# Patient Record
Sex: Male | Born: 1970
Health system: Southern US, Community
[De-identification: ages and names within clinical notes are randomized; demographics above are authoritative.]

## PROBLEM LIST (undated history)

## (undated) DIAGNOSIS — B019 Varicella without complication: Secondary | ICD-10-CM

## (undated) DIAGNOSIS — F32A Depression, unspecified: Secondary | ICD-10-CM

## (undated) DIAGNOSIS — E785 Hyperlipidemia, unspecified: Secondary | ICD-10-CM

## (undated) DIAGNOSIS — F329 Major depressive disorder, single episode, unspecified: Secondary | ICD-10-CM

## (undated) HISTORY — DX: Depression, unspecified: F32.A

## (undated) HISTORY — DX: Hyperlipidemia, unspecified: E78.5

## (undated) HISTORY — DX: Major depressive disorder, single episode, unspecified: F32.9

## (undated) HISTORY — DX: Varicella without complication: B01.9

---

## 2013-04-14 ENCOUNTER — Ambulatory Visit: Payer: Self-pay | Admitting: Internal Medicine

## 2013-05-26 ENCOUNTER — Ambulatory Visit (INDEPENDENT_AMBULATORY_CARE_PROVIDER_SITE_OTHER): Payer: 59 | Admitting: Internal Medicine

## 2013-05-26 ENCOUNTER — Encounter: Payer: Self-pay | Admitting: Internal Medicine

## 2013-05-26 VITALS — BP 130/84 | HR 95 | Temp 98.5°F | Ht 72.0 in | Wt 176.5 lb

## 2013-05-26 DIAGNOSIS — A6000 Herpesviral infection of urogenital system, unspecified: Secondary | ICD-10-CM

## 2013-05-26 DIAGNOSIS — Z23 Encounter for immunization: Secondary | ICD-10-CM

## 2013-05-26 DIAGNOSIS — Z0000 Encounter for general adult medical examination without abnormal findings: Secondary | ICD-10-CM

## 2013-05-26 LAB — COMPREHENSIVE METABOLIC PANEL
ALK PHOS: 60 U/L (ref 39–117)
ALT: 39 U/L (ref 0–53)
AST: 32 U/L (ref 0–37)
Albumin: 4.6 g/dL (ref 3.5–5.2)
BILIRUBIN TOTAL: 1 mg/dL (ref 0.3–1.2)
BUN: 15 mg/dL (ref 6–23)
CO2: 31 meq/L (ref 19–32)
Calcium: 9.1 mg/dL (ref 8.4–10.5)
Chloride: 100 mEq/L (ref 96–112)
Creatinine, Ser: 1.1 mg/dL (ref 0.4–1.5)
GFR: 80.54 mL/min (ref >60.00)
Glucose, Bld: 72 mg/dL (ref 70–99)
Potassium: 4 mEq/L (ref 3.5–5.1)
SODIUM: 140 meq/L (ref 135–145)
Total Protein: 7.4 g/dL (ref 6.0–8.3)

## 2013-05-26 LAB — CBC
HCT: 45.8 % (ref 39.0–52.0)
Hemoglobin: 15.4 g/dL (ref 13.0–17.0)
MCHC: 33.8 g/dL (ref 30.0–36.0)
MCV: 89.2 fl (ref 78.0–100.0)
Platelets: 241 10*3/uL (ref 150.0–400.0)
RBC: 5.13 Mil/uL (ref 4.22–5.81)
RDW: 12.9 % (ref 11.5–14.6)
WBC: 6.9 10*3/uL (ref 4.5–10.5)

## 2013-05-26 LAB — LIPID PANEL
CHOL/HDL RATIO: 6
Cholesterol: 215 mg/dL — ABNORMAL HIGH (ref 0–200)
HDL: 37.5 mg/dL — ABNORMAL LOW (ref >39.00)
LDL CALC: 143 mg/dL — AB (ref 0–99)
Triglycerides: 171 mg/dL — ABNORMAL HIGH (ref 0.0–149.0)
VLDL: 34.2 mg/dL (ref 0.0–40.0)

## 2013-05-26 MED ORDER — ACYCLOVIR 200 MG PO CAPS: 200.0000 mg | ORAL_CAPSULE | Freq: Two times a day (BID) | ORAL | Status: DC

## 2013-05-26 NOTE — Addendum Note (Signed)
Addended by: Lurlean Nanny on: 05/26/2013 02:06 PM   Modules accepted: Orders

## 2013-05-26 NOTE — Progress Notes (Signed)
Pre visit review using our clinic review tool, if applicable. No additional management support is needed unless otherwise documented below in the visit note. 

## 2013-05-26 NOTE — Assessment & Plan Note (Signed)
Will start acycolvir

## 2013-05-26 NOTE — Progress Notes (Signed)
HPI  Pt presents to the clinic today to establish care. He is transferring care from Dr. Arelia Sneddon at [facility] practice. He has no concerns.  Flu: never Tetanus: more than 10 years ago Eye Doctor: as needed Dentist: biannually  Past Medical History  Diagnosis Date  . Chicken pox   . Hyperlipidemia   . Depression     No current outpatient prescriptions on file.   No current facility-administered medications for this visit.    No Known Allergies  Family History  Problem Relation Age of Onset  . Diabetes Father     History   Social History  . Marital Status: Married    Spouse Name: N/A    Number of Children: N/A  . Years of Education: N/A   Occupational History  . Not on file.   Social History Main Topics  . Smoking status: Never Smoker   . Smokeless tobacco: Never Used  . Alcohol Use: Yes     Comment: occasional  . Drug Use: No  . Sexual Activity: Not on file   Other Topics Concern  . Not on file   Social History Narrative  . No narrative on file    ROS:  Constitutional: Denies fever, malaise, fatigue, headache or abrupt weight changes.  HEENT: Denies eye pain, eye redness, ear pain, ringing in the ears, wax buildup, runny nose, nasal congestion, bloody nose, or sore throat. Respiratory: Denies difficulty breathing, shortness of breath, cough or sputum production.   Cardiovascular: Denies chest pain, chest tightness, palpitations or swelling in the hands or feet.  Gastrointestinal: Denies abdominal pain, bloating, constipation, diarrhea or blood in the stool.  GU: Denies frequency, urgency, pain with urination, blood in urine, odor or discharge. Musculoskeletal: Denies decrease in range of motion, difficulty with gait, muscle pain or joint pain and swelling.  Skin: Pt reports rash on genitalia.   Neurological: Denies dizziness, difficulty with memory, difficulty with speech or problems with balance and coordination.   No other specific  complaints in a complete review of systems (except as listed in HPI above).  PE:  BP 130/84  Pulse 95  Temp(Src) 98.5 F (36.9 C) (Oral)  Ht 6' (1.829 m)  Wt 176 lb 8 oz (80.06 kg)  BMI 23.93 kg/m2  SpO2 98% Wt Readings from Last 3 Encounters:  05/26/13 176 lb 8 oz (80.06 kg)    General: Appears his stated age, well developed, well nourished in NAD. Skin: Herpes noted on genitalia.  HEENT: Head: normal shape and size; Eyes: sclera white, no icterus, conjunctiva pink, PERRLA and EOMs intact; Ears: Tm's gray and intact, normal light reflex; Nose: mucosa pink and moist, septum midline; Throat/Mouth: Teeth present, mucosa pink and moist, no lesions or ulcerations noted.  Neck: Normal range of motion. Neck supple, trachea midline. No massses, lumps or thyromegaly present.  Cardiovascular: Normal rate and rhythm. S1,S2 noted.  No murmur, rubs or gallops noted. No JVD or BLE edema. No carotid bruits noted. Pulmonary/Chest: Normal effort and positive vesicular breath sounds. No respiratory distress. No wheezes, rales or ronchi noted.  Abdomen: Soft and nontender. Normal bowel sounds, no bruits noted. No distention or masses noted. Liver, spleen and kidneys non palpable. Musculoskeletal: Normal range of motion. No signs of joint swelling. No difficulty with gait.  Neurological: Alert and oriented. Cranial nerves II-XII intact. Coordination normal. +DTRs bilaterally. Psychiatric: Mood and affect normal. Behavior is normal. Judgment and thought content normal.      Assessment and Plan:  Preventative Health  Maintenance:  Will give Tdap today Will obtain basic screening labs today  RTC in 1 year or sooner if needed

## 2013-05-26 NOTE — Patient Instructions (Addendum)

## 2013-09-22 ENCOUNTER — Ambulatory Visit: Payer: 59 | Admitting: Psychology

## 2014-02-26 HISTORY — PX: LUMBAR DISC SURGERY: SHX700

## 2014-07-29 ENCOUNTER — Ambulatory Visit (INDEPENDENT_AMBULATORY_CARE_PROVIDER_SITE_OTHER): Payer: 59 | Admitting: Internal Medicine

## 2014-07-29 ENCOUNTER — Encounter: Payer: Self-pay | Admitting: Internal Medicine

## 2014-07-29 VITALS — BP 132/88 | HR 71 | Temp 98.1°F | Wt 189.0 lb

## 2014-07-29 DIAGNOSIS — M5442 Lumbago with sciatica, left side: Secondary | ICD-10-CM

## 2014-07-29 DIAGNOSIS — A6000 Herpesviral infection of urogenital system, unspecified: Secondary | ICD-10-CM | POA: Diagnosis not present

## 2014-07-29 MED ORDER — PREDNISONE 10 MG PO TABS: ORAL_TABLET | ORAL | Status: DC

## 2014-07-29 MED ORDER — ACYCLOVIR 200 MG PO CAPS: 200.0000 mg | ORAL_CAPSULE | Freq: Two times a day (BID) | ORAL | Status: DC

## 2014-07-29 MED ORDER — NABUMETONE 750 MG PO TABS: 750.0000 mg | ORAL_TABLET | Freq: Two times a day (BID) | ORAL | Status: DC

## 2014-07-29 NOTE — Patient Instructions (Addendum)
Prednisone and Relafen have been sent to your pharmacy. Start the Relafen after you finish the Prednisone. See Vaughan Basta on the way out to schedule your MRI I am requesting your previous imagine from [facility] was also refill  Sciatica with Rehab The sciatic nerve runs from the back down the leg and is responsible for sensation and control of the muscles in the back (posterior) side of the thigh, lower leg, and foot. Sciatica is a condition that is characterized by inflammation of this nerve.  SYMPTOMS   Signs of nerve damage, including numbness and/or weakness along the posterior side of the lower extremity.  Pain in the back of the thigh that may also travel down the leg.  Pain that worsens when sitting for long periods of time.  Occasionally, pain in the back or buttock. CAUSES  Inflammation of the sciatic nerve is the cause of sciatica. The inflammation is due to something irritating the nerve. Common sources of irritation include:  Sitting for long periods of time.  Direct trauma to the nerve.  Arthritis of the spine.  Herniated or ruptured disk.  Slipping of the vertebrae (spondylolisthesis).  Pressure from soft tissues, such as muscles or ligament-like tissue (fascia). RISK INCREASES WITH:  Sports that place pressure or stress on the spine (football or weightlifting).  Poor strength and flexibility.  Failure to warm up properly before activity.  Family history of low back pain or disk disorders.  Previous back injury or surgery.  Poor body mechanics, especially when lifting, or poor posture. PREVENTION   Warm up and stretch properly before activity.  Maintain physical fitness:  Strength, flexibility, and endurance.  Cardiovascular fitness.  Learn and use proper technique, especially with posture and lifting. When possible, have coach correct improper technique.  Avoid activities that place stress on the spine. PROGNOSIS If treated properly,  then sciatica usually resolves within 6 weeks. However, occasionally surgery is necessary.  RELATED COMPLICATIONS   Permanent nerve damage, including pain, numbness, tingle, or weakness.  Chronic back pain.  Risks of surgery: infection, bleeding, nerve damage, or damage to surrounding tissues. TREATMENT Treatment initially involves resting from any activities that aggravate your symptoms. The use of ice and medication may help reduce pain and inflammation. The use of strengthening and stretching exercises may help reduce pain with activity. These exercises may be performed at home or with referral to a therapist. A therapist may recommend further treatments, such as transcutaneous electronic nerve stimulation (TENS) or ultrasound. Your caregiver may recommend corticosteroid injections to help reduce inflammation of the sciatic nerve. If symptoms persist despite non-surgical (conservative) treatment, then surgery may be recommended. MEDICATION  If pain medication is necessary, then nonsteroidal anti-inflammatory medications, such as aspirin and ibuprofen, or other minor pain relievers, such as acetaminophen, are often recommended.  Do not take pain medication for 7 days before surgery.  Prescription pain relievers may be given if deemed necessary by your caregiver. Use only as directed and only as much as you need.  Ointments applied to the skin may be helpful.  Corticosteroid injections may be given by your caregiver. These injections should be reserved for the most serious cases, because they may only be given a certain number of times. HEAT AND COLD  Cold treatment (icing) relieves pain and reduces inflammation. Cold treatment should be applied for 10 to 15 minutes every 2 to 3 hours for inflammation and pain and immediately after any activity that aggravates your symptoms. Use ice packs or massage the area with  a piece of ice (ice massage).  Heat treatment may be used prior to performing  the stretching and strengthening activities prescribed by your caregiver, physical therapist, or athletic trainer. Use a heat pack or soak the injury in warm water. SEEK MEDICAL CARE IF:  Treatment seems to offer no benefit, or the condition worsens.  Any medications produce adverse side effects. EXERCISES  RANGE OF MOTION (ROM) AND STRETCHING EXERCISES - Sciatica Most people with sciatic will find that their symptoms worsen with either excessive bending forward (flexion) or arching at the low back (extension). The exercises which will help resolve your symptoms will focus on the opposite motion. Your physician, physical therapist or athletic trainer will help you determine which exercises will be most helpful to resolve your low back pain. Do not complete any exercises without first consulting with your clinician. Discontinue any exercises which worsen your symptoms until you speak to your clinician. If you have pain, numbness or tingling which travels down into your buttocks, leg or foot, the goal of the therapy is for these symptoms to move closer to your back and eventually resolve. Occasionally, these leg symptoms will get better, but your low back pain may worsen; this is typically an indication of progress in your rehabilitation. Be certain to be very alert to any changes in your symptoms and the activities in which you participated in the 24 hours prior to the change. Sharing this information with your clinician will allow him/her to most efficiently treat your condition. These exercises may help you when beginning to rehabilitate your injury. Your symptoms may resolve with or without further involvement from your physician, physical therapist or athletic trainer. While completing these exercises, remember:   Restoring tissue flexibility helps normal motion to return to the joints. This allows healthier, less painful movement and activity.  An effective stretch should be held for at least 30  seconds.  A stretch should never be painful. You should only feel a gentle lengthening or release in the stretched tissue. FLEXION RANGE OF MOTION AND STRETCHING EXERCISES: STRETCH - Flexion, Single Knee to Chest   Lie on a firm bed or floor with both legs extended in front of you.  Keeping one leg in contact with the floor, bring your opposite knee to your chest. Hold your leg in place by either grabbing behind your thigh or at your knee.  Pull until you feel a gentle stretch in your low back. Hold __________ seconds.  Slowly release your grasp and repeat the exercise with the opposite side. Repeat __________ times. Complete this exercise __________ times per day.  STRETCH - Flexion, Double Knee to Chest  Lie on a firm bed or floor with both legs extended in front of you.  Keeping one leg in contact with the floor, bring your opposite knee to your chest.  Tense your stomach muscles to support your back and then lift your other knee to your chest. Hold your legs in place by either grabbing behind your thighs or at your knees.  Pull both knees toward your chest until you feel a gentle stretch in your low back. Hold __________ seconds.  Tense your stomach muscles and slowly return one leg at a time to the floor. Repeat __________ times. Complete this exercise __________ times per day.  STRETCH - Low Trunk Rotation   Lie on a firm bed or floor. Keeping your legs in front of you, bend your knees so they are both pointed toward the ceiling and your feet  are flat on the floor.  Extend your arms out to the side. This will stabilize your upper body by keeping your shoulders in contact with the floor.  Gently and slowly drop both knees together to one side until you feel a gentle stretch in your low back. Hold for __________ seconds.  Tense your stomach muscles to support your low back as you bring your knees back to the starting position. Repeat the exercise to the other side. Repeat  __________ times. Complete this exercise __________ times per day  EXTENSION RANGE OF MOTION AND FLEXIBILITY EXERCISES: STRETCH - Extension, Prone on Elbows  Lie on your stomach on the floor, a bed will be too soft. Place your palms about shoulder width apart and at the height of your head.  Place your elbows under your shoulders. If this is too painful, stack pillows under your chest.  Allow your body to relax so that your hips drop lower and make contact more completely with the floor.  Hold this position for __________ seconds.  Slowly return to lying flat on the floor. Repeat __________ times. Complete this exercise __________ times per day.  RANGE OF MOTION - Extension, Prone Press Ups  Lie on your stomach on the floor, a bed will be too soft. Place your palms about shoulder width apart and at the height of your head.  Keeping your back as relaxed as possible, slowly straighten your elbows while keeping your hips on the floor. You may adjust the placement of your hands to maximize your comfort. As you gain motion, your hands will come more underneath your shoulders.  Hold this position __________ seconds.  Slowly return to lying flat on the floor. Repeat __________ times. Complete this exercise __________ times per day.  STRENGTHENING EXERCISES - Sciatica  These exercises may help you when beginning to rehabilitate your injury. These exercises should be done near your "sweet spot." This is the neutral, low-back arch, somewhere between fully rounded and fully arched, that is your least painful position. When performed in this safe range of motion, these exercises can be used for people who have either a flexion or extension based injury. These exercises may resolve your symptoms with or without further involvement from your physician, physical therapist or athletic trainer. While completing these exercises, remember:   Muscles can gain both the endurance and the strength needed for  everyday activities through controlled exercises.  Complete these exercises as instructed by your physician, physical therapist or athletic trainer. Progress with the resistance and repetition exercises only as your caregiver advises.  You may experience muscle soreness or fatigue, but the pain or discomfort you are trying to eliminate should never worsen during these exercises. If this pain does worsen, stop and make certain you are following the directions exactly. If the pain is still present after adjustments, discontinue the exercise until you can discuss the trouble with your clinician. STRENGTHENING - Deep Abdominals, Pelvic Tilt   Lie on a firm bed or floor. Keeping your legs in front of you, bend your knees so they are both pointed toward the ceiling and your feet are flat on the floor.  Tense your lower abdominal muscles to press your low back into the floor. This motion will rotate your pelvis so that your tail bone is scooping upwards rather than pointing at your feet or into the floor.  With a gentle tension and even breathing, hold this position for __________ seconds. Repeat __________ times. Complete this exercise __________ times per  day.  STRENGTHENING - Abdominals, Crunches   Lie on a firm bed or floor. Keeping your legs in front of you, bend your knees so they are both pointed toward the ceiling and your feet are flat on the floor. Cross your arms over your chest.  Slightly tip your chin down without bending your neck.  Tense your abdominals and slowly lift your trunk high enough to just clear your shoulder blades. Lifting higher can put excessive stress on the low back and does not further strengthen your abdominal muscles.  Control your return to the starting position. Repeat __________ times. Complete this exercise __________ times per day.  STRENGTHENING - Quadruped, Opposite UE/LE Lift  Assume a hands and knees position on a firm surface. Keep your hands under your  shoulders and your knees under your hips. You may place padding under your knees for comfort.  Find your neutral spine and gently tense your abdominal muscles so that you can maintain this position. Your shoulders and hips should form a rectangle that is parallel with the floor and is not twisted.  Keeping your trunk steady, lift your right hand no higher than your shoulder and then your left leg no higher than your hip. Make sure you are not holding your breath. Hold this position __________ seconds.  Continuing to keep your abdominal muscles tense and your back steady, slowly return to your starting position. Repeat with the opposite arm and leg. Repeat __________ times. Complete this exercise __________ times per day.  STRENGTHENING - Abdominals and Quadriceps, Straight Leg Raise   Lie on a firm bed or floor with both legs extended in front of you.  Keeping one leg in contact with the floor, bend the other knee so that your foot can rest flat on the floor.  Find your neutral spine, and tense your abdominal muscles to maintain your spinal position throughout the exercise.  Slowly lift your straight leg off the floor about 6 inches for a count of 15, making sure to not hold your breath.  Still keeping your neutral spine, slowly lower your leg all the way to the floor. Repeat this exercise with each leg __________ times. Complete this exercise __________ times per day. POSTURE AND BODY MECHANICS CONSIDERATIONS - Sciatica Keeping correct posture when sitting, standing or completing your activities will reduce the stress put on different body tissues, allowing injured tissues a chance to heal and limiting painful experiences. The following are general guidelines for improved posture. Your physician or physical therapist will provide you with any instructions specific to your needs. While reading these guidelines, remember:  The exercises prescribed by your provider will help you have the  flexibility and strength to maintain correct postures.  The correct posture provides the optimal environment for your joints to work. All of your joints have less wear and tear when properly supported by a spine with good posture. This means you will experience a healthier, less painful body.  Correct posture must be practiced with all of your activities, especially prolonged sitting and standing. Correct posture is as important when doing repetitive low-stress activities (typing) as it is when doing a single heavy-load activity (lifting). RESTING POSITIONS Consider which positions are most painful for you when choosing a resting position. If you have pain with flexion-based activities (sitting, bending, stooping, squatting), choose a position that allows you to rest in a less flexed posture. You would want to avoid curling into a fetal position on your side. If your pain worsens with  extension-based activities (prolonged standing, working overhead), avoid resting in an extended position such as sleeping on your stomach. Most people will find more comfort when they rest with their spine in a more neutral position, neither too rounded nor too arched. Lying on a non-sagging bed on your side with a pillow between your knees, or on your back with a pillow under your knees will often provide some relief. Keep in mind, being in any one position for a prolonged period of time, no matter how correct your posture, can still lead to stiffness. PROPER SITTING POSTURE In order to minimize stress and discomfort on your spine, you must sit with correct posture Sitting with good posture should be effortless for a healthy body. Returning to good posture is a gradual process. Many people can work toward this most comfortably by using various supports until they have the flexibility and strength to maintain this posture on their own. When sitting with proper posture, your ears will fall over your shoulders and your shoulders  will fall over your hips. You should use the back of the chair to support your upper back. Your low back will be in a neutral position, just slightly arched. You may place a small pillow or folded towel at the base of your low back for support.  When working at a desk, create an environment that supports good, upright posture. Without extra support, muscles fatigue and lead to excessive strain on joints and other tissues. Keep these recommendations in mind: CHAIR:   A chair should be able to slide under your desk when your back makes contact with the back of the chair. This allows you to work closely.  The chair's height should allow your eyes to be level with the upper part of your monitor and your hands to be slightly lower than your elbows. BODY POSITION  Your feet should make contact with the floor. If this is not possible, use a foot rest.  Keep your ears over your shoulders. This will reduce stress on your neck and low back. INCORRECT SITTING POSTURES   If you are feeling tired and unable to assume a healthy sitting posture, do not slouch or slump. This puts excessive strain on your back tissues, causing more damage and pain. Healthier options include:  Using more support, like a lumbar pillow.  Switching tasks to something that requires you to be upright or walking.  Talking a brief walk.  Lying down to rest in a neutral-spine position. PROLONGED STANDING WHILE SLIGHTLY LEANING FORWARD  When completing a task that requires you to lean forward while standing in one place for a long time, place either foot up on a stationary 2-4 inch high object to help maintain the best posture. When both feet are on the ground, the low back tends to lose its slight inward curve. If this curve flattens (or becomes too large), then the back and your other joints will experience too much stress, fatigue more quickly and can cause pain.  CORRECT STANDING POSTURES Proper standing posture should be assumed  with all daily activities, even if they only take a few moments, like when brushing your teeth. As in sitting, your ears should fall over your shoulders and your shoulders should fall over your hips. You should keep a slight tension in your abdominal muscles to brace your spine. Your tailbone should point down to the ground, not behind your body, resulting in an over-extended swayback posture.  INCORRECT STANDING POSTURES  Common incorrect standing postures  include a forward head, locked knees and/or an excessive swayback. WALKING Walk with an upright posture. Your ears, shoulders and hips should all line-up. PROLONGED ACTIVITY IN A FLEXED POSITION When completing a task that requires you to bend forward at your waist or lean over a low surface, try to find a way to stabilize 3 of 4 of your limbs. You can place a hand or elbow on your thigh or rest a knee on the surface you are reaching across. This will provide you more stability so that your muscles do not fatigue as quickly. By keeping your knees relaxed, or slightly bent, you will also reduce stress across your low back. CORRECT LIFTING TECHNIQUES DO :   Assume a wide stance. This will provide you more stability and the opportunity to get as close as possible to the object which you are lifting.  Tense your abdominals to brace your spine; then bend at the knees and hips. Keeping your back locked in a neutral-spine position, lift using your leg muscles. Lift with your legs, keeping your back straight.  Test the weight of unknown objects before attempting to lift them.  Try to keep your elbows locked down at your sides in order get the best strength from your shoulders when carrying an object.  Always ask for help when lifting heavy or awkward objects. INCORRECT LIFTING TECHNIQUES DO NOT:   Lock your knees when lifting, even if it is a small object.  Bend and twist. Pivot at your feet or move your feet when needing to change  directions.  Assume that you cannot safely pick up a paperclip without proper posture. Document Released: 02/12/2005 Document Revised: 06/29/2013 Document Reviewed: 05/27/2008 [facility] Patient Information 2015 [place], [place]. This information is not intended to replace advice given to you by your health care provider. Make sure you discuss any questions you have with your health care provider. Sciatica with Rehab The sciatic nerve runs from the back down the leg and is responsible for sensation and control of the muscles in the back (posterior) side of the thigh, lower leg, and foot. Sciatica is a condition that is characterized by inflammation of this nerve.  SYMPTOMS   Signs of nerve damage, including numbness and/or weakness along the posterior side of the lower extremity.  Pain in the back of the thigh that may also travel down the leg.  Pain that worsens when sitting for long periods of time.  Occasionally, pain in the back or buttock. CAUSES  Inflammation of the sciatic nerve is the cause of sciatica. The inflammation is due to something irritating the nerve. Common sources of irritation include:  Sitting for long periods of time.  Direct trauma to the nerve.  Arthritis of the spine.  Herniated or ruptured disk.  Slipping of the vertebrae (spondylolisthesis).  Pressure from soft tissues, such as muscles or ligament-like tissue (fascia). RISK INCREASES WITH:  Sports that place pressure or stress on the spine (football or weightlifting).  Poor strength and flexibility.  Failure to warm up properly before activity.  Family history of low back pain or disk disorders.  Previous back injury or surgery.  Poor body mechanics, especially when lifting, or poor posture. PREVENTION   Warm up and stretch properly before activity.  Maintain physical fitness:  Strength, flexibility, and endurance.  Cardiovascular fitness.  Learn and use proper technique, especially with  posture and lifting. When possible, have coach correct improper technique.  Avoid activities that place stress on the spine. PROGNOSIS If  treated properly, then sciatica usually resolves within 6 weeks. However, occasionally surgery is necessary.  RELATED COMPLICATIONS   Permanent nerve damage, including pain, numbness, tingle, or weakness.  Chronic back pain.  Risks of surgery: infection, bleeding, nerve damage, or damage to surrounding tissues. TREATMENT Treatment initially involves resting from any activities that aggravate your symptoms. The use of ice and medication may help reduce pain and inflammation. The use of strengthening and stretching exercises may help reduce pain with activity. These exercises may be performed at home or with referral to a therapist. A therapist may recommend further treatments, such as transcutaneous electronic nerve stimulation (TENS) or ultrasound. Your caregiver may recommend corticosteroid injections to help reduce inflammation of the sciatic nerve. If symptoms persist despite non-surgical (conservative) treatment, then surgery may be recommended. MEDICATION  If pain medication is necessary, then nonsteroidal anti-inflammatory medications, such as aspirin and ibuprofen, or other minor pain relievers, such as acetaminophen, are often recommended.  Do not take pain medication for 7 days before surgery.  Prescription pain relievers may be given if deemed necessary by your caregiver. Use only as directed and only as much as you need.  Ointments applied to the skin may be helpful.  Corticosteroid injections may be given by your caregiver. These injections should be reserved for the most serious cases, because they may only be given a certain number of times. HEAT AND COLD  Cold treatment (icing) relieves pain and reduces inflammation. Cold treatment should be applied for 10 to 15 minutes every 2 to 3 hours for inflammation and pain and immediately after any  activity that aggravates your symptoms. Use ice packs or massage the area with a piece of ice (ice massage).  Heat treatment may be used prior to performing the stretching and strengthening activities prescribed by your caregiver, physical therapist, or athletic trainer. Use a heat pack or soak the injury in warm water. SEEK MEDICAL CARE IF:  Treatment seems to offer no benefit, or the condition worsens.  Any medications produce adverse side effects. EXERCISES  RANGE OF MOTION (ROM) AND STRETCHING EXERCISES - Sciatica Most people with sciatic will find that their symptoms worsen with either excessive bending forward (flexion) or arching at the low back (extension). The exercises which will help resolve your symptoms will focus on the opposite motion. Your physician, physical therapist or athletic trainer will help you determine which exercises will be most helpful to resolve your low back pain. Do not complete any exercises without first consulting with your clinician. Discontinue any exercises which worsen your symptoms until you speak to your clinician. If you have pain, numbness or tingling which travels down into your buttocks, leg or foot, the goal of the therapy is for these symptoms to move closer to your back and eventually resolve. Occasionally, these leg symptoms will get better, but your low back pain may worsen; this is typically an indication of progress in your rehabilitation. Be certain to be very alert to any changes in your symptoms and the activities in which you participated in the 24 hours prior to the change. Sharing this information with your clinician will allow him/her to most efficiently treat your condition. These exercises may help you when beginning to rehabilitate your injury. Your symptoms may resolve with or without further involvement from your physician, physical therapist or athletic trainer. While completing these exercises, remember:   Restoring tissue flexibility  helps normal motion to return to the joints. This allows healthier, less painful movement and activity.  An  effective stretch should be held for at least 30 seconds.  A stretch should never be painful. You should only feel a gentle lengthening or release in the stretched tissue. FLEXION RANGE OF MOTION AND STRETCHING EXERCISES: STRETCH - Flexion, Single Knee to Chest   Lie on a firm bed or floor with both legs extended in front of you.  Keeping one leg in contact with the floor, bring your opposite knee to your chest. Hold your leg in place by either grabbing behind your thigh or at your knee.  Pull until you feel a gentle stretch in your low back. Hold __________ seconds.  Slowly release your grasp and repeat the exercise with the opposite side. Repeat __________ times. Complete this exercise __________ times per day.  STRETCH - Flexion, Double Knee to Chest  Lie on a firm bed or floor with both legs extended in front of you.  Keeping one leg in contact with the floor, bring your opposite knee to your chest.  Tense your stomach muscles to support your back and then lift your other knee to your chest. Hold your legs in place by either grabbing behind your thighs or at your knees.  Pull both knees toward your chest until you feel a gentle stretch in your low back. Hold __________ seconds.  Tense your stomach muscles and slowly return one leg at a time to the floor. Repeat __________ times. Complete this exercise __________ times per day.  STRETCH - Low Trunk Rotation   Lie on a firm bed or floor. Keeping your legs in front of you, bend your knees so they are both pointed toward the ceiling and your feet are flat on the floor.  Extend your arms out to the side. This will stabilize your upper body by keeping your shoulders in contact with the floor.  Gently and slowly drop both knees together to one side until you feel a gentle stretch in your low back. Hold for __________  seconds.  Tense your stomach muscles to support your low back as you bring your knees back to the starting position. Repeat the exercise to the other side. Repeat __________ times. Complete this exercise __________ times per day  EXTENSION RANGE OF MOTION AND FLEXIBILITY EXERCISES: STRETCH - Extension, Prone on Elbows  Lie on your stomach on the floor, a bed will be too soft. Place your palms about shoulder width apart and at the height of your head.  Place your elbows under your shoulders. If this is too painful, stack pillows under your chest.  Allow your body to relax so that your hips drop lower and make contact more completely with the floor.  Hold this position for __________ seconds.  Slowly return to lying flat on the floor. Repeat __________ times. Complete this exercise __________ times per day.  RANGE OF MOTION - Extension, Prone Press Ups  Lie on your stomach on the floor, a bed will be too soft. Place your palms about shoulder width apart and at the height of your head.  Keeping your back as relaxed as possible, slowly straighten your elbows while keeping your hips on the floor. You may adjust the placement of your hands to maximize your comfort. As you gain motion, your hands will come more underneath your shoulders.  Hold this position __________ seconds.  Slowly return to lying flat on the floor. Repeat __________ times. Complete this exercise __________ times per day.  STRENGTHENING EXERCISES - Sciatica  These exercises may help you when beginning to  rehabilitate your injury. These exercises should be done near your "sweet spot." This is the neutral, low-back arch, somewhere between fully rounded and fully arched, that is your least painful position. When performed in this safe range of motion, these exercises can be used for people who have either a flexion or extension based injury. These exercises may resolve your symptoms with or without further involvement from your  physician, physical therapist or athletic trainer. While completing these exercises, remember:   Muscles can gain both the endurance and the strength needed for everyday activities through controlled exercises.  Complete these exercises as instructed by your physician, physical therapist or athletic trainer. Progress with the resistance and repetition exercises only as your caregiver advises.  You may experience muscle soreness or fatigue, but the pain or discomfort you are trying to eliminate should never worsen during these exercises. If this pain does worsen, stop and make certain you are following the directions exactly. If the pain is still present after adjustments, discontinue the exercise until you can discuss the trouble with your clinician. STRENGTHENING - Deep Abdominals, Pelvic Tilt   Lie on a firm bed or floor. Keeping your legs in front of you, bend your knees so they are both pointed toward the ceiling and your feet are flat on the floor.  Tense your lower abdominal muscles to press your low back into the floor. This motion will rotate your pelvis so that your tail bone is scooping upwards rather than pointing at your feet or into the floor.  With a gentle tension and even breathing, hold this position for __________ seconds. Repeat __________ times. Complete this exercise __________ times per day.  STRENGTHENING - Abdominals, Crunches   Lie on a firm bed or floor. Keeping your legs in front of you, bend your knees so they are both pointed toward the ceiling and your feet are flat on the floor. Cross your arms over your chest.  Slightly tip your chin down without bending your neck.  Tense your abdominals and slowly lift your trunk high enough to just clear your shoulder blades. Lifting higher can put excessive stress on the low back and does not further strengthen your abdominal muscles.  Control your return to the starting position. Repeat __________ times. Complete this  exercise __________ times per day.  STRENGTHENING - Quadruped, Opposite UE/LE Lift  Assume a hands and knees position on a firm surface. Keep your hands under your shoulders and your knees under your hips. You may place padding under your knees for comfort.  Find your neutral spine and gently tense your abdominal muscles so that you can maintain this position. Your shoulders and hips should form a rectangle that is parallel with the floor and is not twisted.  Keeping your trunk steady, lift your right hand no higher than your shoulder and then your left leg no higher than your hip. Make sure you are not holding your breath. Hold this position __________ seconds.  Continuing to keep your abdominal muscles tense and your back steady, slowly return to your starting position. Repeat with the opposite arm and leg. Repeat __________ times. Complete this exercise __________ times per day.  STRENGTHENING - Abdominals and Quadriceps, Straight Leg Raise   Lie on a firm bed or floor with both legs extended in front of you.  Keeping one leg in contact with the floor, bend the other knee so that your foot can rest flat on the floor.  Find your neutral spine, and tense your  abdominal muscles to maintain your spinal position throughout the exercise.  Slowly lift your straight leg off the floor about 6 inches for a count of 15, making sure to not hold your breath.  Still keeping your neutral spine, slowly lower your leg all the way to the floor. Repeat this exercise with each leg __________ times. Complete this exercise __________ times per day. POSTURE AND BODY MECHANICS CONSIDERATIONS - Sciatica Keeping correct posture when sitting, standing or completing your activities will reduce the stress put on different body tissues, allowing injured tissues a chance to heal and limiting painful experiences. The following are general guidelines for improved posture. Your physician or physical therapist will provide  you with any instructions specific to your needs. While reading these guidelines, remember:  The exercises prescribed by your provider will help you have the flexibility and strength to maintain correct postures.  The correct posture provides the optimal environment for your joints to work. All of your joints have less wear and tear when properly supported by a spine with good posture. This means you will experience a healthier, less painful body.  Correct posture must be practiced with all of your activities, especially prolonged sitting and standing. Correct posture is as important when doing repetitive low-stress activities (typing) as it is when doing a single heavy-load activity (lifting). RESTING POSITIONS Consider which positions are most painful for you when choosing a resting position. If you have pain with flexion-based activities (sitting, bending, stooping, squatting), choose a position that allows you to rest in a less flexed posture. You would want to avoid curling into a fetal position on your side. If your pain worsens with extension-based activities (prolonged standing, working overhead), avoid resting in an extended position such as sleeping on your stomach. Most people will find more comfort when they rest with their spine in a more neutral position, neither too rounded nor too arched. Lying on a non-sagging bed on your side with a pillow between your knees, or on your back with a pillow under your knees will often provide some relief. Keep in mind, being in any one position for a prolonged period of time, no matter how correct your posture, can still lead to stiffness. PROPER SITTING POSTURE In order to minimize stress and discomfort on your spine, you must sit with correct posture Sitting with good posture should be effortless for a healthy body. Returning to good posture is a gradual process. Many people can work toward this most comfortably by using various supports until they have  the flexibility and strength to maintain this posture on their own. When sitting with proper posture, your ears will fall over your shoulders and your shoulders will fall over your hips. You should use the back of the chair to support your upper back. Your low back will be in a neutral position, just slightly arched. You may place a small pillow or folded towel at the base of your low back for support.  When working at a desk, create an environment that supports good, upright posture. Without extra support, muscles fatigue and lead to excessive strain on joints and other tissues. Keep these recommendations in mind: CHAIR:   A chair should be able to slide under your desk when your back makes contact with the back of the chair. This allows you to work closely.  The chair's height should allow your eyes to be level with the upper part of your monitor and your hands to be slightly lower than your elbows.  BODY POSITION  Your feet should make contact with the floor. If this is not possible, use a foot rest.  Keep your ears over your shoulders. This will reduce stress on your neck and low back. INCORRECT SITTING POSTURES   If you are feeling tired and unable to assume a healthy sitting posture, do not slouch or slump. This puts excessive strain on your back tissues, causing more damage and pain. Healthier options include:  Using more support, like a lumbar pillow.  Switching tasks to something that requires you to be upright or walking.  Talking a brief walk.  Lying down to rest in a neutral-spine position. PROLONGED STANDING WHILE SLIGHTLY LEANING FORWARD  When completing a task that requires you to lean forward while standing in one place for a long time, place either foot up on a stationary 2-4 inch high object to help maintain the best posture. When both feet are on the ground, the low back tends to lose its slight inward curve. If this curve flattens (or becomes too large), then the back and  your other joints will experience too much stress, fatigue more quickly and can cause pain.  CORRECT STANDING POSTURES Proper standing posture should be assumed with all daily activities, even if they only take a few moments, like when brushing your teeth. As in sitting, your ears should fall over your shoulders and your shoulders should fall over your hips. You should keep a slight tension in your abdominal muscles to brace your spine. Your tailbone should point down to the ground, not behind your body, resulting in an over-extended swayback posture.  INCORRECT STANDING POSTURES  Common incorrect standing postures include a forward head, locked knees and/or an excessive swayback. WALKING Walk with an upright posture. Your ears, shoulders and hips should all line-up. PROLONGED ACTIVITY IN A FLEXED POSITION When completing a task that requires you to bend forward at your waist or lean over a low surface, try to find a way to stabilize 3 of 4 of your limbs. You can place a hand or elbow on your thigh or rest a knee on the surface you are reaching across. This will provide you more stability so that your muscles do not fatigue as quickly. By keeping your knees relaxed, or slightly bent, you will also reduce stress across your low back. CORRECT LIFTING TECHNIQUES DO :   Assume a wide stance. This will provide you more stability and the opportunity to get as close as possible to the object which you are lifting.  Tense your abdominals to brace your spine; then bend at the knees and hips. Keeping your back locked in a neutral-spine position, lift using your leg muscles. Lift with your legs, keeping your back straight.  Test the weight of unknown objects before attempting to lift them.  Try to keep your elbows locked down at your sides in order get the best strength from your shoulders when carrying an object.  Always ask for help when lifting heavy or awkward objects. INCORRECT LIFTING TECHNIQUES DO  NOT:   Lock your knees when lifting, even if it is a small object.  Bend and twist. Pivot at your feet or move your feet when needing to change directions.  Assume that you cannot safely pick up a paperclip without proper posture. Document Released: 02/12/2005 Document Revised: 06/29/2013 Document Reviewed: 05/27/2008 [facility] Patient Information 2015 [place], [place]. This information is not intended to replace advice given to you by your health care provider. Make sure you  discuss any questions you have with your health care provider.  

## 2014-07-29 NOTE — Progress Notes (Addendum)
Subjective:    Patient ID: Douglas Avila, male    DOB: [birth year], 44 y.o.   MRN: [id number]  HPI  Pt presents to the clinic today with c/o low back pain. This started 1 week ago. The pain radiates down his left leg. He describes the pain as throbbing in his lower back, and a burning sensation down his leg. Walking seems to make the pain worse. Sitting and laying down make it better. He denies any recent injury to the area. He denies loss of bowel or bladder. He has tried Ibuprofen 800 mg without relief. He has also tried stretches that he learned from PT in the past. He did have a back injury from 20 years ago. He has seen Dr. Micheline Chapman at [facility] in the past. He diud have an xray and MRI about 2 years ago. He reports he has bulging disc in his back. He reports the pain is worse now than ever before.  He also request refill of his Acyclovir. He is not currently having an outbreak.  Review of Systems      Past Medical History  Diagnosis Date  . Chicken pox   . Hyperlipidemia   . Depression     Current Outpatient Prescriptions  Medication Sig Dispense Refill  . acyclovir (ZOVIRAX) 200 MG capsule Take 1 capsule (200 mg total) by mouth 2 (two) times daily. 60 capsule 5   No current facility-administered medications for this visit.    No Known Allergies  Family History  Problem Relation Age of Onset  . Diabetes Father   . Cancer Neg Hx   . Stroke Neg Hx   . Hearing loss Neg Hx     History   Social History  . Marital Status: Married    Spouse Name: N/A  . Number of Children: N/A  . Years of Education: N/A   Occupational History  . Not on file.   Social History Main Topics  . Smoking status: Never Smoker   . Smokeless tobacco: Never Used  . Alcohol Use: 3.6 oz/week    6 Cans of beer per week     Comment: occasional  . Drug Use: No  . Sexual Activity: Yes   Other Topics Concern  . Not on file   Social History Narrative     Constitutional: Denies  fever, malaise, fatigue, headache or abrupt weight changes.  Respiratory: Denies difficulty breathing, shortness of breath, cough or sputum production.   Cardiovascular: Denies chest pain, chest tightness, palpitations or swelling in the hands or feet.  Gastrointestinal: Denies abdominal pain, bloating, constipation, diarrhea or blood in the stool.  GU: Denies urgency, frequency, pain with urination, burning sensation, blood in urine, odor or discharge. Musculoskeletal: Pt reports low back pain pain. Denies difficulty with gait, muscle pain or joint pain and swelling. .  Neurological: Denies numbness or tingling in hands or feet, problems with balance and coordination.   No other specific complaints in a complete review of systems (except as listed in HPI above).  Objective:   Physical Exam   BP 132/88 mmHg  Pulse 71  Temp(Src) 98.1 F (36.7 C) (Oral)  Wt 189 lb (85.73 kg)  SpO2 98% Wt Readings from Last 3 Encounters:  07/29/14 189 lb (85.73 kg)  05/26/13 176 lb 8 oz (80.06 kg)    General: Appears his stated age, well developed, well nourished in NAD. Cardiovascular: Normal rate and rhythm. S1,S2 noted.  No murmur, rubs or gallops noted.  Pulmonary/Chest: Normal effort  and positive vesicular breath sounds. No respiratory distress. No wheezes, rales or ronchi noted.  Musculoskeletal: Decreased flexion of the spine. Normal extension. Pain with rotation to the left. Normal rotation to the right. No pain with palpation over the lumbar spine. Strength 5/5 BLE. No difficulty with gait.  Neurological: Alert and oriented. Sensation intact to BLE. Positive straight leg raise at about 25 degrees on the left.    BMET    Component Value Date/Time   NA 140 05/26/2013 1422   K 4.0 05/26/2013 1422   CL 100 05/26/2013 1422   CO2 31 05/26/2013 1422   GLUCOSE 72 05/26/2013 1422   BUN 15 05/26/2013 1422   CREATININE 1.1 05/26/2013 1422   CALCIUM 9.1 05/26/2013 1422    Lipid Panel       Component Value Date/Time   CHOL 215* 05/26/2013 1422   TRIG 171.0* 05/26/2013 1422   HDL 37.50* 05/26/2013 1422   CHOLHDL 6 05/26/2013 1422   VLDL 34.2 05/26/2013 1422   LDLCALC 143* 05/26/2013 1422    CBC    Component Value Date/Time   WBC 6.9 05/26/2013 1422   RBC 5.13 05/26/2013 1422   HGB 15.4 05/26/2013 1422   HCT 45.8 05/26/2013 1422   PLT 241.0 05/26/2013 1422   MCV 89.2 05/26/2013 1422   MCHC 33.8 05/26/2013 1422   RDW 12.9 05/26/2013 1422    Hgb A1C No results found for: HGBA1C      Assessment & Plan:   Low back pain with left side sciatica:  Will request previous lumbar xray and MRI from [facility] for Pred taper eRx for nabumetone BID after you finish the Pred taper Stretching exercises given Given worsening pain, will repeat MRI (see Vaughan Basta on the way out to schedule), he may need referral to neurosurgery  Will follow up after MRI, RTC as needed or if pain persist or worsens

## 2014-07-29 NOTE — Progress Notes (Signed)
Pre visit review using our clinic review tool, if applicable. No additional management support is needed unless otherwise documented below in the visit note. 

## 2014-07-29 NOTE — Assessment & Plan Note (Signed)
Acyclovir refilled.

## 2014-08-02 ENCOUNTER — Ambulatory Visit (HOSPITAL_COMMUNITY)
Admission: RE | Admit: 2014-08-02 | Discharge: 2014-08-02 | Disposition: A | Discharge status: Home / Self Care | Payer: 59 | Source: Ambulatory Visit | Attending: Internal Medicine | Admitting: Internal Medicine

## 2014-08-02 DIAGNOSIS — M5126 Other intervertebral disc displacement, lumbar region: Secondary | ICD-10-CM | POA: Insufficient documentation

## 2014-08-02 DIAGNOSIS — M5442 Lumbago with sciatica, left side: Secondary | ICD-10-CM | POA: Diagnosis present

## 2014-08-02 DIAGNOSIS — D1809 Hemangioma of other sites: Secondary | ICD-10-CM | POA: Insufficient documentation

## 2014-08-02 DIAGNOSIS — M4806 Spinal stenosis, lumbar region: Secondary | ICD-10-CM | POA: Insufficient documentation

## 2014-08-13 ENCOUNTER — Other Ambulatory Visit: Payer: 59

## 2015-05-23 ENCOUNTER — Encounter: Payer: Self-pay | Admitting: Internal Medicine

## 2015-05-24 ENCOUNTER — Other Ambulatory Visit: Payer: Self-pay | Admitting: Internal Medicine

## 2015-05-24 MED ORDER — OSELTAMIVIR PHOSPHATE 75 MG PO CAPS: 75.0000 mg | ORAL_CAPSULE | Freq: Every day | ORAL | Status: DC

## 2015-07-18 ENCOUNTER — Ambulatory Visit (INDEPENDENT_AMBULATORY_CARE_PROVIDER_SITE_OTHER): Payer: 59 | Admitting: Internal Medicine

## 2015-07-18 ENCOUNTER — Encounter: Payer: Self-pay | Admitting: Internal Medicine

## 2015-07-18 VITALS — BP 124/78 | HR 63 | Temp 97.9°F | Ht 72.0 in | Wt 178.8 lb

## 2015-07-18 DIAGNOSIS — R6889 Other general symptoms and signs: Secondary | ICD-10-CM

## 2015-07-18 DIAGNOSIS — Z0001 Encounter for general adult medical examination with abnormal findings: Secondary | ICD-10-CM | POA: Diagnosis not present

## 2015-07-18 DIAGNOSIS — L409 Psoriasis, unspecified: Secondary | ICD-10-CM

## 2015-07-18 DIAGNOSIS — A6000 Herpesviral infection of urogenital system, unspecified: Secondary | ICD-10-CM

## 2015-07-18 LAB — LIPID PANEL
CHOLESTEROL: 181 mg/dL (ref 0–200)
HDL: 33.9 mg/dL — AB (ref >39.00)
LDL Cholesterol: 129 mg/dL — ABNORMAL HIGH (ref 0–99)
NonHDL: 146.83
TRIGLYCERIDES: 91 mg/dL (ref 0.0–149.0)
Total CHOL/HDL Ratio: 5
VLDL: 18.2 mg/dL (ref 0.0–40.0)

## 2015-07-18 LAB — COMPREHENSIVE METABOLIC PANEL
ALBUMIN: 4.6 g/dL (ref 3.5–5.2)
ALK PHOS: 67 U/L (ref 39–117)
ALT: 34 U/L (ref 0–53)
AST: 25 U/L (ref 0–37)
BUN: 14 mg/dL (ref 6–23)
CO2: 29 mEq/L (ref 19–32)
Calcium: 9.1 mg/dL (ref 8.4–10.5)
Chloride: 103 mEq/L (ref 96–112)
Creatinine, Ser: 0.93 mg/dL (ref 0.40–1.50)
GFR: 93.73 mL/min (ref >60.00)
Glucose, Bld: 99 mg/dL (ref 70–99)
Potassium: 4.1 mEq/L (ref 3.5–5.1)
Sodium: 139 mEq/L (ref 135–145)
TOTAL PROTEIN: 6.9 g/dL (ref 6.0–8.3)
Total Bilirubin: 0.4 mg/dL (ref 0.2–1.2)

## 2015-07-18 LAB — CBC
HEMATOCRIT: 48.7 % (ref 39.0–52.0)
HEMOGLOBIN: 16.4 g/dL (ref 13.0–17.0)
MCHC: 33.6 g/dL (ref 30.0–36.0)
MCV: 88.2 fl (ref 78.0–100.0)
PLATELETS: 228 10*3/uL (ref 150.0–400.0)
RBC: 5.52 Mil/uL (ref 4.22–5.81)
RDW: 13.6 % (ref 11.5–15.5)
WBC: 8.2 10*3/uL (ref 4.0–10.5)

## 2015-07-18 MED ORDER — ACYCLOVIR 200 MG PO CAPS: 200.0000 mg | ORAL_CAPSULE | Freq: Two times a day (BID) | ORAL | Status: DC

## 2015-07-18 MED ORDER — CLOBETASOL PROPIONATE 0.05 % EX CREA: 1.0000 "application " | TOPICAL_CREAM | Freq: Two times a day (BID) | CUTANEOUS | Status: DC

## 2015-07-18 NOTE — Patient Instructions (Signed)

## 2015-07-18 NOTE — Addendum Note (Signed)
Addended by: Marchia Bond on: 07/18/2015 03:19 PM   Modules accepted: Miquel Dunn

## 2015-07-18 NOTE — Addendum Note (Signed)
Addended by: Jearld Fenton on: 07/18/2015 08:42 AM   Modules accepted: Miquel Dunn

## 2015-07-18 NOTE — Progress Notes (Signed)
Pre visit review using our clinic review tool, if applicable. No additional management support is needed unless otherwise documented below in the visit note. 

## 2015-07-18 NOTE — Progress Notes (Signed)
Subjective:    Patient ID: Douglas Avila, male    DOB: 27-Jan-1971, 45 y.o.   MRN: [id number]  HPI  Pt presents to the clinic today for his annual exam.  Flu: never Tetanus: 05/26/2013 Vision Screening: as needed Dentist: annually  Diet: He does eat meat. He consumes fruits and veggies daily.  He does consumes some fried foods. He drinks mostly water. Exercise: He rides a bike 2 x week.  HSV: He would like a refill of his Acyclovir today. He is not currently having an outbreak.  Review of Systems      Past Medical History  Diagnosis Date  . Chicken pox   . Hyperlipidemia   . Depression     Current Outpatient Prescriptions  Medication Sig Dispense Refill  . acyclovir (ZOVIRAX) 200 MG capsule Take 1 capsule (200 mg total) by mouth 2 (two) times daily. 60 capsule 5  . nabumetone (RELAFEN) 750 MG tablet Take 1 tablet (750 mg total) by mouth 2 (two) times daily. 60 tablet 1  . oseltamivir (TAMIFLU) 75 MG capsule Take 1 capsule (75 mg total) by mouth daily. 10 capsule 0  . predniSONE (DELTASONE) 10 MG tablet Take 6 tabs day 1, 5 tabs day 2, 4 tabs day 3, 3 tabs day 4, 2 tabs day 5, 1 tab day 6 21 tablet 0   No current facility-administered medications for this visit.    No Known Allergies  Family History  Problem Relation Age of Onset  . Diabetes Father   . Cancer Neg Hx   . Stroke Neg Hx   . Hearing loss Neg Hx     Social History   Social History  . Marital Status: Legally Separated    Spouse Name: N/A  . Number of Children: N/A  . Years of Education: N/A   Occupational History  . Not on file.   Social History Main Topics  . Smoking status: Never Smoker   . Smokeless tobacco: Never Used  . Alcohol Use: 3.6 oz/week    6 Cans of beer per week     Comment: occasional  . Drug Use: No  . Sexual Activity: Yes   Other Topics Concern  . Not on file   Social History Narrative     Constitutional: Denies fever, malaise, fatigue, headache or abrupt  weight changes.  HEENT: Denies eye pain, eye redness, ear pain, ringing in the ears, wax buildup, runny nose, nasal congestion, bloody nose, or sore throat. Respiratory: Denies difficulty breathing, shortness of breath, cough or sputum production.   Cardiovascular: Denies chest pain, chest tightness, palpitations or swelling in the hands or feet.  Gastrointestinal: Denies abdominal pain, bloating, constipation, diarrhea or blood in the stool.  GU: Denies urgency, frequency, pain with urination, burning sensation, blood in urine, odor or discharge. Musculoskeletal: Denies decrease in range of motion, difficulty with gait, muscle pain or joint pain and swelling.  Skin: Pt reports rash to left knee. Denies redness, lesions or ulcercations.  Neurological: Denies dizziness, difficulty with memory, difficulty with speech or problems with balance and coordination.  Psych: Denies anxiety, depression, SI/HI.  No other specific complaints in a complete review of systems (except as listed in HPI above).  Objective:   Physical Exam   BP 124/78 mmHg  Pulse 63  Temp(Src) 97.9 F (36.6 C) (Oral)  Ht 6' (1.829 m)  Wt 178 lb 12 oz (81.08 kg)  BMI 24.24 kg/m2  SpO2 98% Wt Readings from Last 3 Encounters:  07/18/15  178 lb 12 oz (81.08 kg)  07/29/14 189 lb (85.73 kg)  05/26/13 176 lb 8 oz (80.06 kg)    General: Appears his stated age, well developed, well nourished in NAD. Skin: Warm, dry and intact. Scaly white plaque noted to left anterior knee. HEENT: Head: normal shape and size; Eyes: sclera white, no icterus, conjunctiva pink, PERRLA and EOMs intact; Ears: Tm's gray and intact, normal light reflex; Throat/Mouth: Teeth present, mucosa pink and moist, no exudate, lesions or ulcerations noted.  Neck:  Neck supple, trachea midline. No masses, lumps or thyromegaly present.  Cardiovascular: Normal rate and rhythm. S1,S2 noted.  No murmur, rubs or gallops noted. No JVD or BLE edema.  Pulmonary/Chest:  Normal effort and positive vesicular breath sounds. No respiratory distress. No wheezes, rales or ronchi noted.  Abdomen: Soft and nontender. Normal bowel sounds. No distention or masses noted. Liver, spleen and kidneys non palpable. Musculoskeletal: Strength 5/5 BUE/BLE. No signs of joint swelling. No difficulty with gait.  Neurological: Alert and oriented. Cranial nerves II-XII grossly intact. Coordination normal.  Psychiatric: Mood and affect normal. Behavior is normal. Judgment and thought content normal.    BMET    Component Value Date/Time   NA 140 05/26/2013 1422   K 4.0 05/26/2013 1422   CL 100 05/26/2013 1422   CO2 31 05/26/2013 1422   GLUCOSE 72 05/26/2013 1422   BUN 15 05/26/2013 1422   CREATININE 1.1 05/26/2013 1422   CALCIUM 9.1 05/26/2013 1422    Lipid Panel     Component Value Date/Time   CHOL 215* 05/26/2013 1422   TRIG 171.0* 05/26/2013 1422   HDL 37.50* 05/26/2013 1422   CHOLHDL 6 05/26/2013 1422   VLDL 34.2 05/26/2013 1422   LDLCALC 143* 05/26/2013 1422    CBC    Component Value Date/Time   WBC 6.9 05/26/2013 1422   RBC 5.13 05/26/2013 1422   HGB 15.4 05/26/2013 1422   HCT 45.8 05/26/2013 1422   PLT 241.0 05/26/2013 1422   MCV 89.2 05/26/2013 1422   MCHC 33.8 05/26/2013 1422   RDW 12.9 05/26/2013 1422    Hgb A1C No results found for: HGBA1C      Assessment & Plan:   Preventative Health Maintenance:  Encouraged him to get a flu shot in the fall Tetanus UTD Encouraged him to consume a balanced diet and exercise regimen Encouraged him to see an eye doctor and dentist at least annually Will check CBC, CMET, Lipid Profile today  Psoriasis:  eRx for Clobetasol cream BID   Genital Herpes:  Acyclovir refilled today  RTC in 1 year, sooner if needed

## 2016-09-03 ENCOUNTER — Ambulatory Visit: Payer: Self-pay | Admitting: Internal Medicine

## 2016-09-19 ENCOUNTER — Ambulatory Visit (INDEPENDENT_AMBULATORY_CARE_PROVIDER_SITE_OTHER): Payer: 59 | Admitting: Internal Medicine

## 2016-09-19 ENCOUNTER — Encounter: Payer: Self-pay | Admitting: Internal Medicine

## 2016-09-19 VITALS — BP 120/80 | HR 74 | Temp 98.1°F | Ht 71.75 in | Wt 186.0 lb

## 2016-09-19 DIAGNOSIS — Z0000 Encounter for general adult medical examination without abnormal findings: Secondary | ICD-10-CM | POA: Diagnosis not present

## 2016-09-19 NOTE — Patient Instructions (Signed)

## 2016-09-19 NOTE — Progress Notes (Signed)
Subjective:    Patient ID: Douglas Avila, male    DOB: 07/29/1970, 46 y.o.   MRN: [id number]  HPI  Pt presents to the clinic today for his annual exam.  Flu: never Tetanus: 04/2013 Vision Screening: as needed Dentist: biannually  Diet: He does eat meat. He consumes fruits and veggies daily. He occasionally eats fried foods. He drinks mostly water. Exercise: He does 20 minutes of exercise daily at home.  Review of Systems      Past Medical History:  Diagnosis Date  . Chicken pox   . Depression   . Hyperlipidemia     Current Outpatient Prescriptions  Medication Sig Dispense Refill  . acyclovir (ZOVIRAX) 200 MG capsule Take 1 capsule (200 mg total) by mouth 2 (two) times daily. 60 capsule 5  . clobetasol cream (TEMOVATE) 0.81 % Apply 1 application topically 2 (two) times daily. 30 g 0   No current facility-administered medications for this visit.     No Known Allergies  Family History  Problem Relation Age of Onset  . Diabetes Father   . Cancer Neg Hx   . Stroke Neg Hx   . Hearing loss Neg Hx     Social History   Social History  . Marital status: Legally Separated    Spouse name: N/A  . Number of children: N/A  . Years of education: N/A   Occupational History  . Not on file.   Social History Main Topics  . Smoking status: Never Smoker  . Smokeless tobacco: Never Used  . Alcohol use 3.6 oz/week    6 Cans of beer per week     Comment: occasional  . Drug use: No  . Sexual activity: Yes   Other Topics Concern  . Not on file   Social History Narrative  . No narrative on file     Constitutional: Denies fever, malaise, fatigue, headache or abrupt weight changes.  HEENT: Denies eye pain, eye redness, ear pain, ringing in the ears, wax buildup, runny nose, nasal congestion, bloody nose, or sore throat. Respiratory: Denies difficulty breathing, shortness of breath, cough or sputum production.   Cardiovascular: Denies chest pain, chest tightness,  palpitations or swelling in the hands or feet.  Gastrointestinal: Denies abdominal pain, bloating, constipation, diarrhea or blood in the stool.  GU: Pt reports difficulty initiating and maintaining erections. Denies urgency, frequency, pain with urination, burning sensation, blood in urine, odor or discharge. Musculoskeletal: Denies decrease in range of motion, difficulty with gait, muscle pain or joint pain and swelling.  Skin: Denies redness, rashes, lesions or ulcercations.  Neurological: Denies dizziness, difficulty with memory, difficulty with speech or problems with balance and coordination.  Psych: Denies anxiety, depression, SI/HI.  No other specific complaints in a complete review of systems (except as listed in HPI above).  Objective:   Physical Exam   BP 120/80   Pulse 74   Temp 98.1 F (36.7 C) (Oral)   Ht 5' 11.75" (1.822 m)   Wt 186 lb (84.4 kg)   SpO2 97%   BMI 25.40 kg/m  Wt Readings from Last 3 Encounters:  09/19/16 186 lb (84.4 kg)  07/18/15 178 lb 12 oz (81.1 kg)  07/29/14 189 lb (85.7 kg)    General: Appears his stated age, well developed, well nourished, in NAD. Skin: Warm, dry and intact.  HEENT: Head: normal shape and size; Eyes: sclera white, no icterus, conjunctiva pink, PERRLA and EOMs intact; Ears: Tm's gray and intact, normal light reflex;  Throat/Mouth: Teeth present, mucosa pink and moist, no exudate, lesions or ulcerations noted.  Neck:  Neck supple, trachea midline. No masses, lumps or thyromegaly present.  Cardiovascular: Normal rate and rhythm. S1,S2 noted.  No murmur, rubs or gallops noted. No JVD or BLE edema.  Pulmonary/Chest: Normal effort and positive vesicular breath sounds. No respiratory distress. No wheezes, rales or ronchi noted.  Abdomen: Soft and nontender. Normal bowel sounds. No distention or masses noted. Liver, spleen and kidneys non palpable. Musculoskeletal: Strength 5/5 BUE/BLE. No difficulty with gait.  Neurological: Alert and  oriented. Cranial nerves II-XII grossly intact. Coordination normal.  Psychiatric: Mood and affect normal. Behavior is normal. Judgment and thought content normal.    BMET    Component Value Date/Time   NA 139 07/18/2015 0839   K 4.1 07/18/2015 0839   CL 103 07/18/2015 0839   CO2 29 07/18/2015 0839   GLUCOSE 99 07/18/2015 0839   BUN 14 07/18/2015 0839   CREATININE 0.93 07/18/2015 0839   CALCIUM 9.1 07/18/2015 0839    Lipid Panel     Component Value Date/Time   CHOL 181 07/18/2015 0839   TRIG 91.0 07/18/2015 0839   HDL 33.90 (L) 07/18/2015 0839   CHOLHDL 5 07/18/2015 0839   VLDL 18.2 07/18/2015 0839   LDLCALC 129 (H) 07/18/2015 0839    CBC    Component Value Date/Time   WBC 8.2 07/18/2015 0839   RBC 5.52 07/18/2015 0839   HGB 16.4 07/18/2015 0839   HCT 48.7 07/18/2015 0839   PLT 228.0 07/18/2015 0839   MCV 88.2 07/18/2015 0839   MCHC 33.6 07/18/2015 0839   RDW 13.6 07/18/2015 0839    Hgb A1C No results found for: HGBA1C         Assessment & Plan:   Preventative Health Maintenance:  Encouraged her to get a flu shot in the fall Tetanus UTD Encouraged him to consume a balanced diet and exercise regimen  Advised him to see an eye doctor and dentist annually Will check CBC, CMET, Lipid profile today  RTC in 1 year, sooner if needed Webb Silversmith, NP

## 2016-09-20 LAB — COMPREHENSIVE METABOLIC PANEL
ALK PHOS: 51 U/L (ref 39–117)
ALT: 36 U/L (ref 0–53)
AST: 24 U/L (ref 0–37)
Albumin: 4.7 g/dL (ref 3.5–5.2)
BILIRUBIN TOTAL: 0.6 mg/dL (ref 0.2–1.2)
BUN: 14 mg/dL (ref 6–23)
CO2: 30 meq/L (ref 19–32)
Calcium: 9.4 mg/dL (ref 8.4–10.5)
Chloride: 102 mEq/L (ref 96–112)
Creatinine, Ser: 1.09 mg/dL (ref 0.40–1.50)
GFR: 77.63 mL/min (ref >60.00)
Glucose, Bld: 79 mg/dL (ref 70–99)
Potassium: 3.9 mEq/L (ref 3.5–5.1)
Sodium: 139 mEq/L (ref 135–145)
Total Protein: 7.4 g/dL (ref 6.0–8.3)

## 2016-09-20 LAB — CBC
HCT: 46.1 % (ref 39.0–52.0)
Hemoglobin: 15.3 g/dL (ref 13.0–17.0)
MCHC: 33.2 g/dL (ref 30.0–36.0)
MCV: 92.1 fl (ref 78.0–100.0)
Platelets: 257 10*3/uL (ref 150.0–400.0)
RBC: 5 Mil/uL (ref 4.22–5.81)
RDW: 13.6 % (ref 11.5–15.5)
WBC: 7.8 10*3/uL (ref 4.0–10.5)

## 2016-09-20 LAB — LIPID PANEL
Cholesterol: 199 mg/dL (ref 0–200)
HDL: 34.4 mg/dL — ABNORMAL LOW (ref >39.00)
LDL Cholesterol: 135 mg/dL — ABNORMAL HIGH (ref 0–99)
NonHDL: 164.34
TRIGLYCERIDES: 149 mg/dL (ref 0.0–149.0)
Total CHOL/HDL Ratio: 6
VLDL: 29.8 mg/dL (ref 0.0–40.0)

## 2016-10-03 ENCOUNTER — Encounter: Payer: Self-pay | Admitting: Internal Medicine

## 2016-10-03 DIAGNOSIS — A6000 Herpesviral infection of urogenital system, unspecified: Secondary | ICD-10-CM

## 2016-10-09 MED ORDER — ACYCLOVIR 200 MG PO CAPS: 200.0000 mg | ORAL_CAPSULE | Freq: Two times a day (BID) | ORAL | 5 refills | Status: DC

## 2016-10-09 MED ORDER — CLOBETASOL PROPIONATE 0.05 % EX CREA
1.0000 | TOPICAL_CREAM | Freq: Two times a day (BID) | CUTANEOUS | 5 refills | Status: DC
Start: 2016-10-09 — End: 2019-05-05

## 2017-01-28 ENCOUNTER — Encounter: Payer: Self-pay | Admitting: Internal Medicine

## 2017-01-28 ENCOUNTER — Ambulatory Visit (INDEPENDENT_AMBULATORY_CARE_PROVIDER_SITE_OTHER): Payer: 59 | Admitting: Internal Medicine

## 2017-01-28 VITALS — BP 130/80 | HR 71 | Temp 98.2°F | Wt 193.5 lb

## 2017-01-28 DIAGNOSIS — N529 Male erectile dysfunction, unspecified: Secondary | ICD-10-CM | POA: Diagnosis not present

## 2017-01-28 DIAGNOSIS — Z23 Encounter for immunization: Secondary | ICD-10-CM | POA: Diagnosis not present

## 2017-01-28 DIAGNOSIS — L409 Psoriasis, unspecified: Secondary | ICD-10-CM | POA: Diagnosis not present

## 2017-01-28 MED ORDER — SILDENAFIL CITRATE 25 MG PO TABS: 25.0000 mg | ORAL_TABLET | Freq: Every day | ORAL | 0 refills | Status: DC | PRN

## 2017-01-28 NOTE — Addendum Note (Signed)
Addended by: Modena Nunnery on: 01/28/2017 11:41 AM   Modules accepted: Orders

## 2017-01-28 NOTE — Progress Notes (Signed)
Subjective:    Patient ID: KYL GIVLER, male    DOB: [birth year], 46 y.o.   MRN: [id number]  HPI  Pt presents to the clinic today to follow up psoriasis. He reports this has been affecting his left knee. It is worse as the winter approaches, with the dry weather. He does not feel like the Clobetasol is effective and is wondering about alternative treatment options.  He also want to discuss ED. He has noticed that he is having more issues initiating and maintaining an erection. He is able to ejaculate. He feels like his performance is really starting to affect his sex life. He would like to try some Viagra.  He would also like a flu shot today.  Review of Systems      Past Medical History:  Diagnosis Date  . Chicken pox   . Depression   . Hyperlipidemia     Current Outpatient Medications  Medication Sig Dispense Refill  . acyclovir (ZOVIRAX) 200 MG capsule Take 1 capsule (200 mg total) by mouth 2 (two) times daily. 60 capsule 5  . clobetasol cream (TEMOVATE) 9.48 % Apply 1 application topically 2 (two) times daily. 30 g 5  . sildenafil (VIAGRA) 25 MG tablet Take 1 tablet (25 mg total) by mouth daily as needed for erectile dysfunction. 10 tablet 0   No current facility-administered medications for this visit.     No Known Allergies  Family History  Problem Relation Age of Onset  . Diabetes Father   . Cancer Neg Hx   . Stroke Neg Hx   . Hearing loss Neg Hx     Social History   Socioeconomic History  . Marital status: Legally Separated    Spouse name: Not on file  . Number of children: Not on file  . Years of education: Not on file  . Highest education level: Not on file  Social Needs  . Financial resource strain: Not on file  . Food insecurity - worry: Not on file  . Food insecurity - inability: Not on file  . Transportation needs - medical: Not on file  . Transportation needs - non-medical: Not on file  Occupational History  . Not on file  Tobacco Use    . Smoking status: Never Smoker  . Smokeless tobacco: Never Used  Substance and Sexual Activity  . Alcohol use: Yes    Alcohol/week: 3.6 oz    Types: 6 Cans of beer per week    Comment: occasional  . Drug use: No  . Sexual activity: Yes  Other Topics Concern  . Not on file  Social History Narrative  . Not on file     Constitutional: Denies fever, malaise, fatigue, headache or abrupt weight changes.  GU: Pt reports difficulty initiating and maintaining an erection. Denies urgency, frequency, pain with urination, burning sensation, blood in urine, odor or discharge. Skin: Pt reports psoriasis on left knee. Denies or ulcercations.    No other specific complaints in a complete review of systems (except as listed in HPI above).  Objective:   Physical Exam   BP 130/80   Pulse 71   Temp 98.2 F (36.8 C) (Oral)   Wt 193 lb 8 oz (87.8 kg)   SpO2 97%   BMI 26.43 kg/m  Wt Readings from Last 3 Encounters:  01/28/17 193 lb 8 oz (87.8 kg)  09/19/16 186 lb (84.4 kg)  07/18/15 178 lb 12 oz (81.1 kg)    General: Appears his stated age,  well developed, well nourished in NAD. Skin: Silvery plaque on erythematous base noted over left knee.  BMET    Component Value Date/Time   NA 139 09/19/2016 1538   K 3.9 09/19/2016 1538   CL 102 09/19/2016 1538   CO2 30 09/19/2016 1538   GLUCOSE 79 09/19/2016 1538   BUN 14 09/19/2016 1538   CREATININE 1.09 09/19/2016 1538   CALCIUM 9.4 09/19/2016 1538    Lipid Panel     Component Value Date/Time   CHOL 199 09/19/2016 1538   TRIG 149.0 09/19/2016 1538   HDL 34.40 (L) 09/19/2016 1538   CHOLHDL 6 09/19/2016 1538   VLDL 29.8 09/19/2016 1538   LDLCALC 135 (H) 09/19/2016 1538    CBC    Component Value Date/Time   WBC 7.8 09/19/2016 1538   RBC 5.00 09/19/2016 1538   HGB 15.3 09/19/2016 1538   HCT 46.1 09/19/2016 1538   PLT 257.0 09/19/2016 1538   MCV 92.1 09/19/2016 1538   MCHC 33.2 09/19/2016 1538   RDW 13.6 09/19/2016 1538     Hgb A1C No results found for: HGBA1C         Assessment & Plan:   Psoriasis:  Referral placed to dermatology for further evaluation  ED:  eRx for Sildenafil 25 mg 1 hour prior to sexual intercourse  Return precautions discussed Webb Silversmith, NP

## 2017-01-28 NOTE — Patient Instructions (Signed)
Psoriasis  Psoriasis is a long-term (chronic) skin condition. It causes raised, red patches (plaques) on your skin that look silvery. The red patches may show up anywhere on your body. They can be any size or shape.  Psoriasis can come and go. It can range from mild to very bad. It cannot be passed from one person to another (not contagious). There is no cure for this condition, but it can be helped with treatment.  Follow these instructions at home:  Skin Care   Apply moisturizers to your skin as needed. Only use those that your doctor has said are okay.   Apply cool compresses to the affected areas.   Do not scratch your skin.  Lifestyle     Do not use tobacco products. This includes cigarettes, chewing tobacco, and e-cigarettes. If you need help quitting, ask your doctor.   Drink little or no alcohol.   Try to lower your stress. Meditation or yoga may help.   Get sun as told by your doctor. Do not get sunburned.   Think about joining a psoriasis support group.  Medicines   Take or use over-the-counter and prescription medicines only as told by your doctor.   If you were prescribed an antibiotic, take or use it as told by your doctor. Do not stop taking the antibiotic even if your condition starts to get better.  General instructions   Keep a journal. Use this to help track what triggers an outbreak. Try to avoid any triggers.   See a counselor or social worker if you feel very sad, upset, or hopeless about your condition and these feelings affect your work or relationships.   Keep all follow-up visits as told by your doctor. This is important.  Contact a doctor if:   Your pain gets worse.   You have more redness or warmth in the affected areas.   You have new pain or stiffness in your joints.   Your pain or stiffness in your joints gets worse.   Your nails start to break easily.   Your nails pull away from the nail bed easily.   You have a fever.   You feel very sad (depressed).  This  information is not intended to replace advice given to you by your health care provider. Make sure you discuss any questions you have with your health care provider.  Document Released: 03/22/2004 Document Revised: 07/21/2015 Document Reviewed: 06/30/2014  Elsevier Interactive Patient Education  2018 [employer]

## 2017-02-25 ENCOUNTER — Encounter: Payer: Self-pay | Admitting: Internal Medicine

## 2017-02-27 MED ORDER — SILDENAFIL CITRATE 50 MG PO TABS: 50.0000 mg | ORAL_TABLET | Freq: Every day | ORAL | 2 refills | Status: DC | PRN

## 2017-04-16 DIAGNOSIS — D1801 Hemangioma of skin and subcutaneous tissue: Secondary | ICD-10-CM | POA: Diagnosis not present

## 2017-04-16 DIAGNOSIS — L400 Psoriasis vulgaris: Secondary | ICD-10-CM | POA: Diagnosis not present

## 2017-04-16 DIAGNOSIS — L858 Other specified epidermal thickening: Secondary | ICD-10-CM | POA: Diagnosis not present

## 2017-06-13 ENCOUNTER — Encounter (HOSPITAL_COMMUNITY): Payer: Self-pay | Admitting: Radiology

## 2017-12-03 ENCOUNTER — Encounter: Payer: Self-pay | Admitting: Internal Medicine

## 2017-12-03 DIAGNOSIS — A6000 Herpesviral infection of urogenital system, unspecified: Secondary | ICD-10-CM

## 2017-12-03 MED ORDER — ACYCLOVIR 200 MG PO CAPS: 200.0000 mg | ORAL_CAPSULE | Freq: Two times a day (BID) | ORAL | 1 refills | Status: DC

## 2017-12-11 ENCOUNTER — Ambulatory Visit (INDEPENDENT_AMBULATORY_CARE_PROVIDER_SITE_OTHER): Payer: 59

## 2017-12-11 DIAGNOSIS — Z23 Encounter for immunization: Secondary | ICD-10-CM | POA: Diagnosis not present

## 2017-12-30 ENCOUNTER — Ambulatory Visit (INDEPENDENT_AMBULATORY_CARE_PROVIDER_SITE_OTHER)
Admission: RE | Admit: 2017-12-30 | Discharge: 2017-12-30 | Disposition: A | Discharge status: Home / Self Care | Payer: 59 | Source: Ambulatory Visit | Attending: Internal Medicine | Admitting: Internal Medicine

## 2017-12-30 ENCOUNTER — Ambulatory Visit (INDEPENDENT_AMBULATORY_CARE_PROVIDER_SITE_OTHER): Payer: 59 | Admitting: Internal Medicine

## 2017-12-30 ENCOUNTER — Encounter: Payer: Self-pay | Admitting: Internal Medicine

## 2017-12-30 VITALS — BP 124/78 | HR 80 | Temp 98.3°F | Wt 193.0 lb

## 2017-12-30 DIAGNOSIS — M25571 Pain in right ankle and joints of right foot: Secondary | ICD-10-CM

## 2017-12-30 DIAGNOSIS — M25471 Effusion, right ankle: Secondary | ICD-10-CM

## 2017-12-30 DIAGNOSIS — S80811A Abrasion, right lower leg, initial encounter: Secondary | ICD-10-CM

## 2017-12-30 DIAGNOSIS — W19XXXA Unspecified fall, initial encounter: Secondary | ICD-10-CM | POA: Diagnosis not present

## 2017-12-30 DIAGNOSIS — M79661 Pain in right lower leg: Secondary | ICD-10-CM

## 2017-12-30 DIAGNOSIS — M7989 Other specified soft tissue disorders: Secondary | ICD-10-CM

## 2017-12-30 DIAGNOSIS — S8011XA Contusion of right lower leg, initial encounter: Secondary | ICD-10-CM | POA: Diagnosis not present

## 2017-12-30 DIAGNOSIS — S9001XA Contusion of right ankle, initial encounter: Secondary | ICD-10-CM | POA: Diagnosis not present

## 2017-12-30 NOTE — Progress Notes (Signed)
Subjective:    Patient ID: Douglas Avila, male    DOB: [birth year], 47 y.o.   MRN: [id number]  HPI  Patient presents clinic complaints of right foot and ankle pain.  He reports this started 2 days ago after he fell off a pickup truck.  He reports it is very decreased range of motion swelling and bruising.  He does have pain with weightbearing.  He denies numbness or tingling in the right lower extremity.  He has tried ibuprofen with minimal relief.  Review of Systems      Past Medical History:  Diagnosis Date  . Chicken pox   . Depression   . Hyperlipidemia     Current Outpatient Medications  Medication Sig Dispense Refill  . acyclovir (ZOVIRAX) 200 MG capsule Take 1 capsule (200 mg total) by mouth 2 (two) times daily. 60 capsule 1  . clobetasol cream (TEMOVATE) 4.85 % Apply 1 application topically 2 (two) times daily. 30 g 5  . sildenafil (VIAGRA) 50 MG tablet Take 1 tablet (50 mg total) by mouth daily as needed for erectile dysfunction. 10 tablet 2   No current facility-administered medications for this visit.     No Known Allergies  Family History  Problem Relation Age of Onset  . Diabetes Father   . Cancer Neg Hx   . Stroke Neg Hx   . Hearing loss Neg Hx     Social History   Socioeconomic History  . Marital status: Legally Separated    Spouse name: Not on file  . Number of children: Not on file  . Years of education: Not on file  . Highest education level: Not on file  Occupational History  . Not on file  Social Needs  . Financial resource strain: Not on file  . Food insecurity:    Worry: Not on file    Inability: Not on file  . Transportation needs:    Medical: Not on file    Non-medical: Not on file  Tobacco Use  . Smoking status: Never Smoker  . Smokeless tobacco: Never Used  Substance and Sexual Activity  . Alcohol use: Yes    Alcohol/week: 6.0 standard drinks    Types: 6 Cans of beer per week    Comment: occasional  . Drug use: No  .  Sexual activity: Yes  Lifestyle  . Physical activity:    Days per week: Not on file    Minutes per session: Not on file  . Stress: Not on file  Relationships  . Social connections:    Talks on phone: Not on file    Gets together: Not on file    Attends religious service: Not on file    Active member of club or organization: Not on file    Attends meetings of clubs or organizations: Not on file    Relationship status: Not on file  . Intimate partner violence:    Fear of current or ex partner: Not on file    Emotionally abused: Not on file    Physically abused: Not on file    Forced sexual activity: Not on file  Other Topics Concern  . Not on file  Social History Narrative  . Not on file     Constitutional: Denies fever, malaise, fatigue, headache or abrupt weight changes.  Musculoskeletal: Patient reports decreased range of motion, joint pain and swelling.  Denies muscle pain.  Skin: Patient reports bruising to right ankle.  Denies redness, rashes, lesions or ulcercations.  Neurological: Denies numbness, tingling or problems with balance and coordination.    No other specific complaints in a complete review of systems (except as listed in HPI above).  Objective:   Physical Exam  Pulse 80   Temp 98.3 F (36.8 C) (Oral)   Wt 193 lb (87.5 kg)   BMI 26.36 kg/m  Wt Readings from Last 3 Encounters:  12/30/17 193 lb (87.5 kg)  01/28/17 193 lb 8 oz (87.8 kg)  09/19/16 186 lb (84.4 kg)    General: Appears his stated age, well developed, well nourished in NAD. Skin: 1 cm abrasion over the right distal tibia.  Bruising noted posterior and inferior to the medial malleolus on the right. Musculoskeletal: Normal flexion, extension and rotation of the right ankle.  Normal plantarflexion.  Pain with dorsiflexion.  Pain with palpation over the distal tibia.  Pain with palpation over the medial malleolus right.  1+ swelling of the right lower extremity. Neurological: Alert and  oriented. Sensation intact to BLE.   BMET    Component Value Date/Time   NA 139 09/19/2016 1538   K 3.9 09/19/2016 1538   CL 102 09/19/2016 1538   CO2 30 09/19/2016 1538   GLUCOSE 79 09/19/2016 1538   BUN 14 09/19/2016 1538   CREATININE 1.09 09/19/2016 1538   CALCIUM 9.4 09/19/2016 1538    Lipid Panel     Component Value Date/Time   CHOL 199 09/19/2016 1538   TRIG 149.0 09/19/2016 1538   HDL 34.40 (L) 09/19/2016 1538   CHOLHDL 6 09/19/2016 1538   VLDL 29.8 09/19/2016 1538   LDLCALC 135 (H) 09/19/2016 1538    CBC    Component Value Date/Time   WBC 7.8 09/19/2016 1538   RBC 5.00 09/19/2016 1538   HGB 15.3 09/19/2016 1538   HCT 46.1 09/19/2016 1538   PLT 257.0 09/19/2016 1538   MCV 92.1 09/19/2016 1538   MCHC 33.2 09/19/2016 1538   RDW 13.6 09/19/2016 1538    Hgb A1C No results found for: HGBA1C         Assessment & Plan:   Right Ankle Pain and Swelling, Right Lower Leg Pain and Swelling, Abrasion Right Lower Leg s/p Fall:  Xray right tib/fib to r/o fracture Xray right ankle to r/o fracture Discussed RICE therapy  Will follow up after xrays are back, return precautions discussed Webb Silversmith, NP

## 2017-12-30 NOTE — Patient Instructions (Signed)
RICE for Routine Care of Injuries Many injuries can be cared for using rest, ice, compression, and elevation (RICE therapy). Using RICE therapy can help to lessen pain and swelling. It can help your body to heal. Rest Reduce your normal activities and avoid using the injured part of your body. You can go back to your normal activities when you feel okay and your doctor says it is okay. Ice Do not put ice on your bare skin.  Put ice in a plastic bag.  Place a towel between your skin and the bag.  Leave the ice on for 20 minutes, 2-3 times a day.  Do this for as long as told by your doctor. Compression Compression means putting pressure on the injured area. This can be done with an elastic bandage. If an elastic bandage has been applied:  Remove and reapply the bandage every 3-4 hours or as told by your doctor.  Make sure the bandage is not wrapped too tight. Wrap the bandage more loosely if part of your body beyond the bandage is blue, swollen, cold, painful, or loses feeling (numb).  See your doctor if the bandage seems to make your problems worse.  Elevation Elevation means keeping the injured area raised. Raise the injured area above your heart or the center of your chest if you can. When should I get help? You should get help if:  You keep having pain and swelling.  Your symptoms get worse.  Get help right away if: You should get help right away if:  You have sudden bad pain at or below the area of your injury.  You have redness or more swelling around your injury.  You have tingling or numbness at or below the injury that does not go away when you take off the bandage.  This information is not intended to replace advice given to you by your health care provider. Make sure you discuss any questions you have with your health care provider. Document Released: 08/01/2007 Document Revised: 01/10/2016 Document Reviewed: 01/20/2014 Elsevier Interactive Patient Education  2017  [employer]  

## 2018-01-01 ENCOUNTER — Encounter: Payer: Self-pay | Admitting: Internal Medicine

## 2018-01-31 ENCOUNTER — Ambulatory Visit (INDEPENDENT_AMBULATORY_CARE_PROVIDER_SITE_OTHER): Payer: 59 | Admitting: Internal Medicine

## 2018-01-31 ENCOUNTER — Encounter: Payer: Self-pay | Admitting: Internal Medicine

## 2018-01-31 VITALS — BP 122/80 | HR 71 | Temp 98.3°F | Ht 71.5 in | Wt 198.0 lb

## 2018-01-31 DIAGNOSIS — N521 Erectile dysfunction due to diseases classified elsewhere: Secondary | ICD-10-CM | POA: Diagnosis not present

## 2018-01-31 DIAGNOSIS — A6001 Herpesviral infection of penis: Secondary | ICD-10-CM | POA: Diagnosis not present

## 2018-01-31 DIAGNOSIS — Z113 Encounter for screening for infections with a predominantly sexual mode of transmission: Secondary | ICD-10-CM | POA: Diagnosis not present

## 2018-01-31 DIAGNOSIS — L409 Psoriasis, unspecified: Secondary | ICD-10-CM | POA: Diagnosis not present

## 2018-01-31 DIAGNOSIS — Z0000 Encounter for general adult medical examination without abnormal findings: Secondary | ICD-10-CM

## 2018-01-31 DIAGNOSIS — Z0001 Encounter for general adult medical examination with abnormal findings: Secondary | ICD-10-CM

## 2018-01-31 DIAGNOSIS — N529 Male erectile dysfunction, unspecified: Secondary | ICD-10-CM | POA: Insufficient documentation

## 2018-01-31 LAB — CBC
HEMATOCRIT: 47.7 % (ref 39.0–52.0)
Hemoglobin: 16.2 g/dL (ref 13.0–17.0)
MCHC: 33.9 g/dL (ref 30.0–36.0)
MCV: 89.6 fl (ref 78.0–100.0)
PLATELETS: 219 10*3/uL (ref 150.0–400.0)
RBC: 5.32 Mil/uL (ref 4.22–5.81)
RDW: 13.3 % (ref 11.5–15.5)
WBC: 9.4 10*3/uL (ref 4.0–10.5)

## 2018-01-31 LAB — COMPREHENSIVE METABOLIC PANEL
ALBUMIN: 5 g/dL (ref 3.5–5.2)
ALK PHOS: 50 U/L (ref 39–117)
ALT: 52 U/L (ref 0–53)
AST: 32 U/L (ref 0–37)
BUN: 19 mg/dL (ref 6–23)
CALCIUM: 9.7 mg/dL (ref 8.4–10.5)
CHLORIDE: 100 meq/L (ref 96–112)
CO2: 31 mEq/L (ref 19–32)
CREATININE: 1.05 mg/dL (ref 0.40–1.50)
GFR: 80.56 mL/min (ref >60.00)
Glucose, Bld: 96 mg/dL (ref 70–99)
Potassium: 3.8 mEq/L (ref 3.5–5.1)
Sodium: 140 mEq/L (ref 135–145)
TOTAL PROTEIN: 7.6 g/dL (ref 6.0–8.3)
Total Bilirubin: 0.7 mg/dL (ref 0.2–1.2)

## 2018-01-31 LAB — LIPID PANEL
CHOLESTEROL: 241 mg/dL — AB (ref 0–200)
HDL: 42.4 mg/dL (ref >39.00)
LDL Cholesterol: 163 mg/dL — ABNORMAL HIGH (ref 0–99)
NonHDL: 198.19
TRIGLYCERIDES: 176 mg/dL — AB (ref 0.0–149.0)
Total CHOL/HDL Ratio: 6
VLDL: 35.2 mg/dL (ref 0.0–40.0)

## 2018-01-31 NOTE — Assessment & Plan Note (Signed)
Continue Viagra prn 

## 2018-01-31 NOTE — Progress Notes (Signed)
Subjective:    Patient ID: Douglas Avila, male    DOB: [birth year], 47 y.o.   MRN: [id number]  HPI  Pt presents to the clinic today for his annual exam. He is also due to follow up chronic conditions.  ED: He has trouble initiating and maintaining an erection. He takes Viagra as needed.  Genital Herpes: He uses Acyclovir Cream as needed for breakouts.  Psoriasis: He uses Clobetasol Cream as needed with good relief.  Flu: 11/2017 Tetanus: 04/2013 Vision Screening: as needed Dentist: biannually  Diet: He does eat meat. He consumes fruits and veggies daily. He does eat some fried foods. He drinks mostly water. Exercise: body weight training  Review of Systems      Past Medical History:  Diagnosis Date  . Chicken pox   . Depression   . Hyperlipidemia     Current Outpatient Medications  Medication Sig Dispense Refill  . acyclovir (ZOVIRAX) 200 MG capsule Take 1 capsule (200 mg total) by mouth 2 (two) times daily. 60 capsule 1  . clobetasol cream (TEMOVATE) 6.38 % Apply 1 application topically 2 (two) times daily. 30 g 5  . sildenafil (VIAGRA) 50 MG tablet Take 1 tablet (50 mg total) by mouth daily as needed for erectile dysfunction. 10 tablet 2   No current facility-administered medications for this visit.     No Known Allergies  Family History  Problem Relation Age of Onset  . Diabetes Father   . Cancer Neg Hx   . Stroke Neg Hx   . Hearing loss Neg Hx     Social History   Socioeconomic History  . Marital status: Divorced    Spouse name: Not on file  . Number of children: Not on file  . Years of education: Not on file  . Highest education level: Not on file  Occupational History  . Not on file  Social Needs  . Financial resource strain: Not on file  . Food insecurity:    Worry: Not on file    Inability: Not on file  . Transportation needs:    Medical: Not on file    Non-medical: Not on file  Tobacco Use  . Smoking status: Never Smoker  .  Smokeless tobacco: Never Used  Substance and Sexual Activity  . Alcohol use: Yes    Alcohol/week: 6.0 standard drinks    Types: 6 Cans of beer per week    Comment: occasional  . Drug use: No  . Sexual activity: Yes  Lifestyle  . Physical activity:    Days per week: Not on file    Minutes per session: Not on file  . Stress: Not on file  Relationships  . Social connections:    Talks on phone: Not on file    Gets together: Not on file    Attends religious service: Not on file    Active member of club or organization: Not on file    Attends meetings of clubs or organizations: Not on file    Relationship status: Not on file  . Intimate partner violence:    Fear of current or ex partner: Not on file    Emotionally abused: Not on file    Physically abused: Not on file    Forced sexual activity: Not on file  Other Topics Concern  . Not on file  Social History Narrative  . Not on file     Constitutional: Denies fever, malaise, fatigue, headache or abrupt weight changes.  HEENT: Pt  reports ringing in ears. Denies eye pain, eye redness, ear pain, ringing in the ears, wax buildup, runny nose, nasal congestion, bloody nose, or sore throat. Respiratory: Denies difficulty breathing, shortness of breath, cough or sputum production.   Cardiovascular: Denies chest pain, chest tightness, palpitations or swelling in the hands or feet.  Gastrointestinal: Denies abdominal pain, bloating, constipation, diarrhea or blood in the stool.  GU: Pt reports erectile dysfunction. Denies urgency, frequency, pain with urination, burning sensation, blood in urine, odor or discharge. Musculoskeletal: Pt reports joint pain in hands. Denies decrease in range of motion, difficulty with gait, muscle pain or joint swelling.  Skin: Pt reports dry skin. Denies redness, rashes, lesions or ulcercations.  Neurological: Denies dizziness, difficulty with memory, difficulty with speech or problems with balance and  coordination.  Psych: Denies anxiety, depression, SI/HI.  No other specific complaints in a complete review of systems (except as listed in HPI above).  Objective:   Physical Exam   BP 122/80   Pulse 71   Temp 98.3 F (36.8 C) (Oral)   Ht 5' 11.5" (1.816 m)   Wt 198 lb (89.8 kg)   SpO2 98%   BMI 27.23 kg/m  Wt Readings from Last 3 Encounters:  01/31/18 198 lb (89.8 kg)  12/30/17 193 lb (87.5 kg)  01/28/17 193 lb 8 oz (87.8 kg)    General: Appears his stated age, well developed, well nourished in NAD. Skin: Warm, dry and intact. HEENT: Head: normal shape and size; Eyes: sclera white, no icterus, conjunctiva pink, PERRLA and EOMs intact; Ears: Tm's gray and intact, normal light reflex; Throat/Mouth: Teeth present, mucosa pink and moist, no exudate, lesions or ulcerations noted.  Neck:  Neck supple, trachea midline. No masses, lumps or thyromegaly present.  Cardiovascular: Normal rate and rhythm. S1,S2 noted.  No murmur, rubs or gallops noted. No JVD or BLE edema. Pulmonary/Chest: Normal effort and positive vesicular breath sounds. No respiratory distress. No wheezes, rales or ronchi noted.  Abdomen: Soft and nontender. Normal bowel sounds. No distention or masses noted. Liver, spleen and kidneys non palpable. Musculoskeletal: Strength 5/5 BUE/BLE. No difficulty with gait.  Neurological: Alert and oriented. Cranial nerves II-XII grossly intact. Coordination normal.  Psychiatric: Mood and affect normal. Behavior is normal. Judgment and thought content normal.     BMET    Component Value Date/Time   NA 139 09/19/2016 1538   K 3.9 09/19/2016 1538   CL 102 09/19/2016 1538   CO2 30 09/19/2016 1538   GLUCOSE 79 09/19/2016 1538   BUN 14 09/19/2016 1538   CREATININE 1.09 09/19/2016 1538   CALCIUM 9.4 09/19/2016 1538    Lipid Panel     Component Value Date/Time   CHOL 199 09/19/2016 1538   TRIG 149.0 09/19/2016 1538   HDL 34.40 (L) 09/19/2016 1538   CHOLHDL 6 09/19/2016  1538   VLDL 29.8 09/19/2016 1538   LDLCALC 135 (H) 09/19/2016 1538    CBC    Component Value Date/Time   WBC 7.8 09/19/2016 1538   RBC 5.00 09/19/2016 1538   HGB 15.3 09/19/2016 1538   HCT 46.1 09/19/2016 1538   PLT 257.0 09/19/2016 1538   MCV 92.1 09/19/2016 1538   MCHC 33.2 09/19/2016 1538   RDW 13.6 09/19/2016 1538    Hgb A1C No results found for: HGBA1C         Assessment & Plan:   Preventative Health Maintenance:  Flu shot UTD Tetanus UTD Encouraged him to consume a balanced diet and exercise  regimen Advised him to see an eye doctor and dentist annually Will check CBC, CMET, Lipid, HIV, RPR, urine gonorrhea, chlamydia and trichomonas  RTC in 1 year, sooner if needed Webb Silversmith, NP

## 2018-01-31 NOTE — Patient Instructions (Signed)

## 2018-01-31 NOTE — Assessment & Plan Note (Signed)
Continue Acyclovir prn

## 2018-01-31 NOTE — Assessment & Plan Note (Signed)
Continue Clobetasol prn Having some joint pain, concerned about psoriatic arthritis- plans to schedule an appt with rheumatology

## 2018-02-03 ENCOUNTER — Encounter: Payer: Self-pay | Admitting: Internal Medicine

## 2018-02-03 DIAGNOSIS — M25542 Pain in joints of left hand: Secondary | ICD-10-CM

## 2018-02-03 DIAGNOSIS — M25541 Pain in joints of right hand: Secondary | ICD-10-CM

## 2018-02-03 LAB — RPR: RPR: NONREACTIVE

## 2018-02-03 LAB — C. TRACHOMATIS/N. GONORRHOEAE RNA
C. TRACHOMATIS RNA, TMA: NOT DETECTED
N. GONORRHOEAE RNA, TMA: NOT DETECTED

## 2018-02-03 LAB — HIV ANTIBODY (ROUTINE TESTING W REFLEX): HIV: NONREACTIVE

## 2018-02-03 LAB — TRICHOMONAS VAGINALIS RNA, QL,MALES: TRICHOMONAS VAGINALIS RNA: NOT DETECTED

## 2018-02-05 ENCOUNTER — Encounter: Payer: Self-pay | Admitting: Internal Medicine

## 2018-02-05 DIAGNOSIS — E782 Mixed hyperlipidemia: Secondary | ICD-10-CM

## 2018-02-05 MED ORDER — ATORVASTATIN CALCIUM 10 MG PO TABS: 10.0000 mg | ORAL_TABLET | Freq: Every day | ORAL | 2 refills | Status: DC

## 2018-02-17 ENCOUNTER — Encounter: Payer: Self-pay | Admitting: Internal Medicine

## 2018-02-17 DIAGNOSIS — H9313 Tinnitus, bilateral: Secondary | ICD-10-CM

## 2018-03-04 ENCOUNTER — Encounter: Payer: Self-pay | Admitting: Internal Medicine

## 2018-03-04 DIAGNOSIS — M79642 Pain in left hand: Secondary | ICD-10-CM | POA: Diagnosis not present

## 2018-03-04 DIAGNOSIS — L401 Generalized pustular psoriasis: Secondary | ICD-10-CM | POA: Diagnosis not present

## 2018-03-04 DIAGNOSIS — M79641 Pain in right hand: Secondary | ICD-10-CM | POA: Diagnosis not present

## 2018-03-04 DIAGNOSIS — M7660 Achilles tendinitis, unspecified leg: Secondary | ICD-10-CM | POA: Diagnosis not present

## 2018-03-04 DIAGNOSIS — M549 Dorsalgia, unspecified: Secondary | ICD-10-CM | POA: Diagnosis not present

## 2018-03-04 DIAGNOSIS — M2550 Pain in unspecified joint: Secondary | ICD-10-CM | POA: Diagnosis not present

## 2018-03-20 DIAGNOSIS — H903 Sensorineural hearing loss, bilateral: Secondary | ICD-10-CM | POA: Diagnosis not present

## 2018-03-20 DIAGNOSIS — H9313 Tinnitus, bilateral: Secondary | ICD-10-CM | POA: Diagnosis not present

## 2018-05-07 ENCOUNTER — Other Ambulatory Visit: Payer: 59

## 2018-06-09 ENCOUNTER — Other Ambulatory Visit: Payer: 59

## 2018-10-09 ENCOUNTER — Encounter: Payer: Self-pay | Admitting: Internal Medicine

## 2018-10-09 DIAGNOSIS — A6000 Herpesviral infection of urogenital system, unspecified: Secondary | ICD-10-CM

## 2018-10-09 MED ORDER — ACYCLOVIR 200 MG PO CAPS: 200.0000 mg | ORAL_CAPSULE | Freq: Two times a day (BID) | ORAL | 1 refills | Status: DC

## 2018-12-01 ENCOUNTER — Ambulatory Visit (INDEPENDENT_AMBULATORY_CARE_PROVIDER_SITE_OTHER): Payer: 59

## 2018-12-01 ENCOUNTER — Other Ambulatory Visit: Payer: Self-pay

## 2018-12-01 DIAGNOSIS — Z23 Encounter for immunization: Secondary | ICD-10-CM | POA: Diagnosis not present

## 2018-12-02 ENCOUNTER — Ambulatory Visit: Payer: 59

## 2019-05-05 ENCOUNTER — Other Ambulatory Visit: Payer: Self-pay

## 2019-05-05 ENCOUNTER — Encounter: Payer: Self-pay | Admitting: Internal Medicine

## 2019-05-05 ENCOUNTER — Ambulatory Visit (INDEPENDENT_AMBULATORY_CARE_PROVIDER_SITE_OTHER): Payer: 59 | Admitting: Internal Medicine

## 2019-05-05 VITALS — BP 120/80 | HR 65 | Temp 97.6°F | Ht 71.5 in | Wt 188.0 lb

## 2019-05-05 DIAGNOSIS — A6000 Herpesviral infection of urogenital system, unspecified: Secondary | ICD-10-CM

## 2019-05-05 DIAGNOSIS — L409 Psoriasis, unspecified: Secondary | ICD-10-CM

## 2019-05-05 DIAGNOSIS — N521 Erectile dysfunction due to diseases classified elsewhere: Secondary | ICD-10-CM | POA: Diagnosis not present

## 2019-05-05 DIAGNOSIS — E785 Hyperlipidemia, unspecified: Secondary | ICD-10-CM | POA: Insufficient documentation

## 2019-05-05 DIAGNOSIS — E782 Mixed hyperlipidemia: Secondary | ICD-10-CM

## 2019-05-05 DIAGNOSIS — Z1211 Encounter for screening for malignant neoplasm of colon: Secondary | ICD-10-CM

## 2019-05-05 DIAGNOSIS — A6001 Herpesviral infection of penis: Secondary | ICD-10-CM

## 2019-05-05 DIAGNOSIS — Z0000 Encounter for general adult medical examination without abnormal findings: Secondary | ICD-10-CM

## 2019-05-05 LAB — CBC
HCT: 47.9 % (ref 39.0–52.0)
Hemoglobin: 16 g/dL (ref 13.0–17.0)
MCHC: 33.5 g/dL (ref 30.0–36.0)
MCV: 89.3 fl (ref 78.0–100.0)
Platelets: 227 10*3/uL (ref 150.0–400.0)
RBC: 5.36 Mil/uL (ref 4.22–5.81)
RDW: 13.3 % (ref 11.5–15.5)
WBC: 6.7 10*3/uL (ref 4.0–10.5)

## 2019-05-05 LAB — COMPREHENSIVE METABOLIC PANEL
ALT: 43 U/L (ref 0–53)
AST: 24 U/L (ref 0–37)
Albumin: 4.6 g/dL (ref 3.5–5.2)
Alkaline Phosphatase: 70 U/L (ref 39–117)
BUN: 16 mg/dL (ref 6–23)
CO2: 32 mEq/L (ref 19–32)
Calcium: 9.4 mg/dL (ref 8.4–10.5)
Chloride: 101 mEq/L (ref 96–112)
Creatinine, Ser: 1.02 mg/dL (ref 0.40–1.50)
GFR: 77.96 mL/min (ref >60.00)
Glucose, Bld: 105 mg/dL — ABNORMAL HIGH (ref 70–99)
Potassium: 4.1 mEq/L (ref 3.5–5.1)
Sodium: 139 mEq/L (ref 135–145)
Total Bilirubin: 0.6 mg/dL (ref 0.2–1.2)
Total Protein: 7 g/dL (ref 6.0–8.3)

## 2019-05-05 LAB — LIPID PANEL
Cholesterol: 200 mg/dL (ref 0–200)
HDL: 36.5 mg/dL — ABNORMAL LOW (ref >39.00)
LDL Cholesterol: 138 mg/dL — ABNORMAL HIGH (ref 0–99)
NonHDL: 163.21
Total CHOL/HDL Ratio: 5
Triglycerides: 124 mg/dL (ref 0.0–149.0)
VLDL: 24.8 mg/dL (ref 0.0–40.0)

## 2019-05-05 MED ORDER — CLOBETASOL PROPIONATE 0.05 % EX CREA: 1.0000 "application " | TOPICAL_CREAM | Freq: Two times a day (BID) | CUTANEOUS | 5 refills | Status: DC

## 2019-05-05 MED ORDER — ACYCLOVIR 200 MG PO CAPS: 200.0000 mg | ORAL_CAPSULE | Freq: Two times a day (BID) | ORAL | 1 refills | Status: DC

## 2019-05-05 NOTE — Assessment & Plan Note (Signed)
CMET and Lipid profile today °Encouraged him to consume a low fat diet °

## 2019-05-05 NOTE — Patient Instructions (Signed)

## 2019-05-05 NOTE — Progress Notes (Signed)
Subjective:    Patient ID: Douglas Avila, male    DOB: 1970-04-01, 49 y.o.   MRN: [id number]  HPI  Pt presents to the clinic today for his annual exam. He is also due to follow up chronic conditions.  Psoriasis: Managed with Clobetasol cream. He would like a refill of this today. He does not follow with dermatology.   HLD: His last LDL was 163, triglycerides 176, 01/2018. He no longer takes Atorvastatin. He tries to consume a low fat diet.  ED: He has difficulty with initiating and maintaining erections. He no longer takes Sildenafil.  Genital Herpes: He denies recent outbreak. He uses Acyclovir as needed for breakouts.  Flu: 11/2018 Tetanus: 04/2013 Covid: 04/24/19 Colon Screening: never Vision Screening: as needed Dentist: biannually  Diet: He does eat meat. He consumes fruits and veggies daily. He drinks mostly coffee, water. Exercise: Body weight exercise 30 minutes 5 days per week  Review of Systems      Past Medical History:  Diagnosis Date  . Chicken pox   . Depression   . Hyperlipidemia     Current Outpatient Medications  Medication Sig Dispense Refill  . acyclovir (ZOVIRAX) 200 MG capsule Take 1 capsule (200 mg total) by mouth 2 (two) times daily. 60 capsule 1  . atorvastatin (LIPITOR) 10 MG tablet Take 1 tablet (10 mg total) by mouth daily. 30 tablet 2  . clobetasol cream (TEMOVATE) AB-123456789 % Apply 1 application topically 2 (two) times daily. 30 g 5  . sildenafil (VIAGRA) 50 MG tablet Take 1 tablet (50 mg total) by mouth daily as needed for erectile dysfunction. 10 tablet 2  . vitamin C (ASCORBIC ACID) 500 MG tablet Take 500 mg by mouth daily.    . Zinc Sulfate (ZINC-220 PO) Take by mouth.     No current facility-administered medications for this visit.    No Known Allergies  Family History  Problem Relation Age of Onset  . Diabetes Father   . Cancer Neg Hx   . Stroke Neg Hx   . Hearing loss Neg Hx     Social History   Socioeconomic History  .  Marital status: Divorced    Spouse name: Not on file  . Number of children: Not on file  . Years of education: Not on file  . Highest education level: Not on file  Occupational History  . Not on file  Tobacco Use  . Smoking status: Never Smoker  . Smokeless tobacco: Never Used  Substance and Sexual Activity  . Alcohol use: Yes    Alcohol/week: 6.0 standard drinks    Types: 6 Cans of beer per week    Comment: occasional  . Drug use: No  . Sexual activity: Yes  Other Topics Concern  . Not on file  Social History Narrative  . Not on file   Social Determinants of Health   Financial Resource Strain:   . Difficulty of Paying Living Expenses: Not on file  Food Insecurity:   . Worried About Charity fundraiser in the Last Year: Not on file  . Ran Out of Food in the Last Year: Not on file  Transportation Needs:   . Lack of Transportation (Medical): Not on file  . Lack of Transportation (Non-Medical): Not on file  Physical Activity:   . Days of Exercise per Week: Not on file  . Minutes of Exercise per Session: Not on file  Stress:   . Feeling of Stress : Not on file  Social Connections:   . Frequency of Communication with Friends and Family: Not on file  . Frequency of Social Gatherings with Friends and Family: Not on file  . Attends Religious Services: Not on file  . Active Member of Clubs or Organizations: Not on file  . Attends Archivist Meetings: Not on file  . Marital Status: Not on file  Intimate Partner Violence:   . Fear of Current or Ex-Partner: Not on file  . Emotionally Abused: Not on file  . Physically Abused: Not on file  . Sexually Abused: Not on file     Constitutional: Denies fever, malaise, fatigue, headache or abrupt weight changes.  HEENT: Denies eye pain, eye redness, ear pain, ringing in the ears, wax buildup, runny nose, nasal congestion, bloody nose, or sore throat. Respiratory: Denies difficulty breathing, shortness of breath, cough or  sputum production.   Cardiovascular: Denies chest pain, chest tightness, palpitations or swelling in the hands or feet.  Gastrointestinal: Denies abdominal pain, bloating, constipation, diarrhea or blood in the stool.  GU: Denies urgency, frequency, pain with urination, burning sensation, blood in urine, odor or discharge. Musculoskeletal: Denies decrease in range of motion, difficulty with gait, muscle pain or joint pain and swelling.  Skin: Pt reports psoriasis of knees. Denies ulcercations.  Neurological: Denies dizziness, difficulty with memory, difficulty with speech or problems with balance and coordination.  Psych: Denies anxiety, depression, SI/HI.  No other specific complaints in a complete review of systems (except as listed in HPI above).  Objective:   Physical Exam  BP 120/80   Pulse 65   Temp 97.6 F (36.4 C) (Temporal)   Ht 5' 11.5" (1.816 m)   Wt 188 lb (85.3 kg)   SpO2 98%   BMI 25.86 kg/m   Wt Readings from Last 3 Encounters:  01/31/18 198 lb (89.8 kg)  12/30/17 193 lb (87.5 kg)  01/28/17 193 lb 8 oz (87.8 kg)    General: Appears his stated age, well developed, well nourished in NAD. Skin: Warm, dry and intact. Silvery plaques on erythematous base noted of bilateral knees. HEENT: Head: normal shape and size; Eyes: sclera white, no icterus, conjunctiva pink, PERRLA and EOMs intact;  Neck:  Neck supple, trachea midline. No masses, lumps or thyromegaly present.  Cardiovascular: Normal rate and rhythm. S1,S2 noted.  No murmur, rubs or gallops noted. No JVD or BLE edema.  Pulmonary/Chest: Normal effort and positive vesicular breath sounds. No respiratory distress. No wheezes, rales or ronchi noted.  Abdomen: Soft and nontender. Normal bowel sounds. No distention or masses noted. Liver, spleen and kidneys non palpable. Musculoskeletal: Strength 5/5 BUE/BLE. No difficulty with gait.  Neurological: Alert and oriented. Cranial nerves II-XII grossly intact. Coordination  normal.  Psychiatric: Mood and affect normal. Behavior is normal. Judgment and thought content normal.    BMET    Component Value Date/Time   NA 140 01/31/2018 1430   K 3.8 01/31/2018 1430   CL 100 01/31/2018 1430   CO2 31 01/31/2018 1430   GLUCOSE 96 01/31/2018 1430   BUN 19 01/31/2018 1430   CREATININE 1.05 01/31/2018 1430   CALCIUM 9.7 01/31/2018 1430    Lipid Panel     Component Value Date/Time   CHOL 241 (H) 01/31/2018 1430   TRIG 176.0 (H) 01/31/2018 1430   HDL 42.40 01/31/2018 1430   CHOLHDL 6 01/31/2018 1430   VLDL 35.2 01/31/2018 1430   LDLCALC 163 (H) 01/31/2018 1430    CBC    Component Value  Date/Time   WBC 9.4 01/31/2018 1430   RBC 5.32 01/31/2018 1430   HGB 16.2 01/31/2018 1430   HCT 47.7 01/31/2018 1430   PLT 219.0 01/31/2018 1430   MCV 89.6 01/31/2018 1430   MCHC 33.9 01/31/2018 1430   RDW 13.3 01/31/2018 1430    Hgb A1C No results found for: HGBA1C         Assessment & Plan:   Preventative Health Maintenance:  Flu shot UTD Tetanus UTD Referral to GI for screening colonoscopy Encouraged him to consume a balanced diet and exercise regimen Advised him to see an eye doctor and dentist annually Will check CBC, CMET, Lipid profile today  RTC in 1 year, sooner if needed Webb Silversmith, NP This visit occurred during the SARS-CoV-2 public health emergency.  Safety protocols were in place, including screening questions prior to the visit, additional usage of staff PPE, and extensive cleaning of exam room while observing appropriate contact time as indicated for disinfecting solutions.

## 2019-05-05 NOTE — Assessment & Plan Note (Signed)
Clobetasol cream refilled today

## 2019-05-05 NOTE — Assessment & Plan Note (Signed)
No longer needs Sildenafil

## 2019-05-05 NOTE — Assessment & Plan Note (Signed)
Acyclovir refilled today 

## 2019-05-12 ENCOUNTER — Telehealth: Payer: Self-pay

## 2019-05-12 ENCOUNTER — Other Ambulatory Visit: Payer: Self-pay

## 2019-05-12 DIAGNOSIS — Z1211 Encounter for screening for malignant neoplasm of colon: Secondary | ICD-10-CM

## 2019-05-12 NOTE — Telephone Encounter (Signed)
Gastroenterology Pre-Procedure Review  Request Date: 06/08/19 Requesting Physician: Dr. Vicente Males  PATIENT REVIEW QUESTIONS: The patient responded to the following health history questions as indicated:    1. Are you having any GI issues? no 2. Do you have a personal history of Polyps? no 3. Do you have a family history of Colon Cancer or Polyps? no 4. Diabetes Mellitus? no 5. Joint replacements in the past 12 months?no 6. Major health problems in the past 3 months?no 7. Any artificial heart valves, MVP, or defibrillator?no    MEDICATIONS & ALLERGIES:    Patient reports the following regarding taking any anticoagulation/antiplatelet therapy:   Plavix, Coumadin, Eliquis, Xarelto, Lovenox, Pradaxa, Brilinta, or Effient? no Aspirin? no  Patient confirms/reports the following medications:  Current Outpatient Medications  Medication Sig Dispense Refill  . acyclovir (ZOVIRAX) 200 MG capsule Take 1 capsule (200 mg total) by mouth 2 (two) times daily. 30 capsule 1  . clobetasol cream (TEMOVATE) AB-123456789 % Apply 1 application topically 2 (two) times daily. 30 g 5  . vitamin C (ASCORBIC ACID) 500 MG tablet Take 500 mg by mouth daily.    . Zinc Sulfate (ZINC-220 PO) Take by mouth.     No current facility-administered medications for this visit.    Patient confirms/reports the following allergies:  No Known Allergies  No orders of the defined types were placed in this encounter.   AUTHORIZATION INFORMATION Primary Insurance: 1D#: Group #:  Secondary Insurance: 1D#: Group #:  SCHEDULE INFORMATION: Date: Monday 06/08/19 Time: Location:[facility]

## 2019-06-01 ENCOUNTER — Telehealth: Payer: Self-pay | Admitting: Gastroenterology

## 2019-06-01 NOTE — Telephone Encounter (Signed)
Patient called to cancel colonoscopy scheduled for 06-08-19 mother had a stroke. He will call later to r/s

## 2019-06-02 NOTE — Telephone Encounter (Signed)
Colonoscopy has been canceled with Trish in Endo.  Thanks,  Merla Sawka, CMA 

## 2019-06-08 ENCOUNTER — Ambulatory Visit: Admit: 2019-06-08 | Payer: 59 | Admitting: Gastroenterology

## 2019-06-08 SURGERY — COLONOSCOPY WITH PROPOFOL
Anesthesia: General

## 2019-09-20 ENCOUNTER — Encounter: Payer: Self-pay | Admitting: Internal Medicine

## 2019-10-05 ENCOUNTER — Ambulatory Visit (INDEPENDENT_AMBULATORY_CARE_PROVIDER_SITE_OTHER): Payer: 59 | Admitting: Internal Medicine

## 2019-10-05 ENCOUNTER — Encounter: Payer: Self-pay | Admitting: Internal Medicine

## 2019-10-05 ENCOUNTER — Other Ambulatory Visit: Payer: Self-pay

## 2019-10-05 VITALS — BP 122/76 | HR 79 | Temp 98.5°F | Wt 187.0 lb

## 2019-10-05 DIAGNOSIS — G8929 Other chronic pain: Secondary | ICD-10-CM

## 2019-10-05 DIAGNOSIS — M5442 Lumbago with sciatica, left side: Secondary | ICD-10-CM

## 2019-10-05 MED ORDER — PREDNISONE 10 MG PO TABS: ORAL_TABLET | ORAL | 0 refills | Status: DC

## 2019-10-05 MED ORDER — CYCLOBENZAPRINE HCL 5 MG PO TABS: 5.0000 mg | ORAL_TABLET | Freq: Three times a day (TID) | ORAL | 0 refills | Status: DC | PRN

## 2019-10-05 NOTE — Patient Instructions (Signed)
Sciatica Rehab Ask your health care provider which exercises are safe for you. Do exercises exactly as told by your health care provider and adjust them as directed. It is normal to feel mild stretching, pulling, tightness, or discomfort as you do these exercises. Stop right away if you feel sudden pain or your pain gets worse. Do not begin these exercises until told by your health care provider. Stretching and range-of-motion exercises These exercises warm up your muscles and joints and improve the movement and flexibility of your hips and back. These exercises also help to relieve pain, numbness, and tingling. Sciatic nerve glide 1. Sit in a chair with your head facing down toward your chest. Place your hands behind your back. Let your shoulders slump forward. 2. Slowly straighten one of your legs while you tilt your head back as if you are looking toward the ceiling. Only straighten your leg as far as you can without making your symptoms worse. 3. Hold this position for __________ seconds. 4. Slowly return to the starting position. 5. Repeat with your other leg. Repeat __________ times. Complete this exercise __________ times a day. Knee to chest with hip adduction and internal rotation  1. Lie on your back on a firm surface with both legs straight. 2. Bend one of your knees and move it up toward your chest until you feel a gentle stretch in your lower back and buttock. Then, move your knee toward the shoulder that is on the opposite side from your leg. This is hip adduction and internal rotation. ? Hold your leg in this position by holding on to the front of your knee. 3. Hold this position for __________ seconds. 4. Slowly return to the starting position. 5. Repeat with your other leg. Repeat __________ times. Complete this exercise __________ times a day. Prone extension on elbows  1. Lie on your abdomen on a firm surface. A bed may be too soft for this exercise. 2. Prop yourself up on  your elbows. 3. Use your arms to help lift your chest up until you feel a gentle stretch in your abdomen and your lower back. ? This will place some of your body weight on your elbows. If this is uncomfortable, try stacking pillows under your chest. ? Your hips should stay down, against the surface that you are lying on. Keep your hip and back muscles relaxed. 4. Hold this position for __________ seconds. 5. Slowly relax your upper body and return to the starting position. Repeat __________ times. Complete this exercise __________ times a day. Strengthening exercises These exercises build strength and endurance in your back. Endurance is the ability to use your muscles for a long time, even after they get tired. Pelvic tilt This exercise strengthens the muscles that lie deep in the abdomen. 1. Lie on your back on a firm surface. Bend your knees and keep your feet flat on the floor. 2. Tense your abdominal muscles. Tip your pelvis up toward the ceiling and flatten your lower back into the floor. ? To help with this exercise, you may place a small towel under your lower back and try to push your back into the towel. 3. Hold this position for __________ seconds. 4. Let your muscles relax completely before you repeat this exercise. Repeat __________ times. Complete this exercise __________ times a day. Alternating arm and leg raises  1. Get on your hands and knees on a firm surface. If you are on a hard floor, you may want to use   padding, such as an exercise mat, to cushion your knees. 2. Line up your arms and legs. Your hands should be directly below your shoulders, and your knees should be directly below your hips. 3. Lift your left leg behind you. At the same time, raise your right arm and straighten it in front of you. ? Do not lift your leg higher than your hip. ? Do not lift your arm higher than your shoulder. ? Keep your abdominal and back muscles tight. ? Keep your hips facing the  ground. ? Do not arch your back. ? Keep your balance carefully, and do not hold your breath. 4. Hold this position for __________ seconds. 5. Slowly return to the starting position. 6. Repeat with your right leg and your left arm. Repeat __________ times. Complete this exercise __________ times a day. Posture and body mechanics Good posture and healthy body mechanics can help to relieve stress in your body's tissues and joints. Body mechanics refers to the movements and positions of your body while you do your daily activities. Posture is part of body mechanics. Good posture means:  Your spine is in its natural S-curve position (neutral).  Your shoulders are pulled back slightly.  Your head is not tipped forward. Follow these guidelines to improve your posture and body mechanics in your everyday activities. Standing   When standing, keep your spine neutral and your feet about hip width apart. Keep a slight bend in your knees. Your ears, shoulders, and hips should line up.  When you do a task in which you stand in one place for a long time, place one foot up on a stable object that is 2-4 inches (5-10 cm) high, such as a footstool. This helps keep your spine neutral. Sitting   When sitting, keep your spine neutral and keep your feet flat on the floor. Use a footrest, if necessary, and keep your thighs parallel to the floor. Avoid rounding your shoulders, and avoid tilting your head forward.  When working at a desk or a computer, keep your desk at a height where your hands are slightly lower than your elbows. Slide your chair under your desk so you are close enough to maintain good posture.  When working at a computer, place your monitor at a height where you are looking straight ahead and you do not have to tilt your head forward or downward to look at the screen. Resting  When lying down and resting, avoid positions that are most painful for you.  If you have pain with activities  such as sitting, bending, stooping, or squatting, lie in a position in which your body does not bend very much. For example, avoid curling up on your side with your arms and knees near your chest (fetal position).  If you have pain with activities such as standing for a long time or reaching with your arms, lie with your spine in a neutral position and bend your knees slightly. Try the following positions: ? Lying on your side with a pillow between your knees. ? Lying on your back with a pillow under your knees. Lifting   When lifting objects, keep your feet at least shoulder width apart and tighten your abdominal muscles.  Bend your knees and hips and keep your spine neutral. It is important to lift using the strength of your legs, not your back. Do not lock your knees straight out.  Always ask for help to lift heavy or awkward objects. This information is not   intended to replace advice given to you by your health care provider. Make sure you discuss any questions you have with your health care provider. Document Revised: 06/06/2018 Document Reviewed: 03/06/2018 [employer] Patient Education  2020 [employer]  

## 2019-10-05 NOTE — Progress Notes (Signed)
Subjective:    Patient ID: Douglas Avila, male    DOB: 06/29/1970, 49 y.o.   MRN: [id number]  HPI  Patient presents the clinic today with complaint of low back pain.  He reports this started 2 weeks ago after pushing something heavy at work.  He describes the pain as dull and achy but can be sharp and burning.  The pain radiates down his left leg.  He reports associated numbness but denies tingling or weakness.  MRI from 07/2014 showed:  IMPRESSION: Mild spinal stenosis L4-5. Left foraminal and left lateral disc protrusion L4-5 is unchanged with probable impingement of the left L5 nerve root in the subarticular recess. Moderately large central and left-sided disc protrusion L5-S1 with impingement of the left S1 nerve root, unchanged from the prior MRI.  He did have surgical intervention by Dr. Sherwood Gambler in 2016.  He has been taking Ibuprofen 800 mg 4 x day with minimal relief of symptoms.  Review of Systems      Past Medical History:  Diagnosis Date  . Chicken pox   . Depression   . Hyperlipidemia     Current Outpatient Medications  Medication Sig Dispense Refill  . acyclovir (ZOVIRAX) 200 MG capsule Take 1 capsule (200 mg total) by mouth 2 (two) times daily. 30 capsule 1  . clobetasol cream (TEMOVATE) 4.58 % Apply 1 application topically 2 (two) times daily. 30 g 5  . vitamin C (ASCORBIC ACID) 500 MG tablet Take 500 mg by mouth daily.    . Zinc Sulfate (ZINC-220 PO) Take by mouth.     No current facility-administered medications for this visit.    No Known Allergies  Family History  Problem Relation Age of Onset  . Diabetes Father   . Cancer Neg Hx   . Stroke Neg Hx   . Hearing loss Neg Hx     Social History   Socioeconomic History  . Marital status: Divorced    Spouse name: Not on file  . Number of children: Not on file  . Years of education: Not on file  . Highest education level: Not on file  Occupational History  . Not on file  Tobacco Use  . Smoking  status: Never Smoker  . Smokeless tobacco: Never Used  Substance and Sexual Activity  . Alcohol use: Yes    Alcohol/week: 6.0 standard drinks    Types: 6 Cans of beer per week    Comment: occasional  . Drug use: No  . Sexual activity: Yes  Other Topics Concern  . Not on file  Social History Narrative  . Not on file   Social Determinants of Health   Financial Resource Strain:   . Difficulty of Paying Living Expenses:   Food Insecurity:   . Worried About Charity fundraiser in the Last Year:   . Arboriculturist in the Last Year:   Transportation Needs:   . Film/video editor (Medical):   Marland Kitchen Lack of Transportation (Non-Medical):   Physical Activity:   . Days of Exercise per Week:   . Minutes of Exercise per Session:   Stress:   . Feeling of Stress :   Social Connections:   . Frequency of Communication with Friends and Family:   . Frequency of Social Gatherings with Friends and Family:   . Attends Religious Services:   . Active Member of Clubs or Organizations:   . Attends Archivist Meetings:   Marland Kitchen Marital Status:  Intimate Partner Violence:   . Fear of Current or Ex-Partner:   . Emotionally Abused:   Marland Kitchen Physically Abused:   . Sexually Abused:      Constitutional: Denies fever, malaise, fatigue, headache or abrupt weight changes.  Respiratory: Denies difficulty breathing, shortness of breath, cough or sputum production.   Cardiovascular: Denies chest pain, chest tightness, palpitations or swelling in the hands or feet.  Gastrointestinal: Denies loss of bowel control. GU: Denies loss of bladder control.. Musculoskeletal: Patient reports low back pain, decreased range of motion.  Denies difficulty with gait, muscle pain or joint swelling.  Neurological: Patient reports numbness in left leg.  Denies dizziness, difficulty with memory, difficulty with speech or problems with balance and coordination.    No other specific complaints in a complete review of  systems (except as listed in HPI above). As Objective:   Physical Exam   BP 122/76   Pulse 79   Temp 98.5 F (36.9 C) (Temporal)   Wt 187 lb (84.8 kg)   SpO2 98%   BMI 25.72 kg/m   Wt Readings from Last 3 Encounters:  05/05/19 188 lb (85.3 kg)  01/31/18 198 lb (89.8 kg)  12/30/17 193 lb (87.5 kg)    General: Appears his stated age, well developed, well nourished in NAD. Skin: Warm, dry and intact. No rashes noted. Cardiovascular: Normal rate Pulmonary/Chest: Normal effort and positive vesicular breath sounds.  Musculoskeletal: Decreased flexion and extension secondary to pain.  Normal rotation.  No bony tenderness noted over the lumbar spine.  Strength 4/5 LLE.  Strength 5/5 RLE.  Limping gait. Neurological: Alert and oriented.  Positive SLR on the left.   BMET    Component Value Date/Time   NA 139 05/05/2019 0953   K 4.1 05/05/2019 0953   CL 101 05/05/2019 0953   CO2 32 05/05/2019 0953   GLUCOSE 105 (H) 05/05/2019 0953   BUN 16 05/05/2019 0953   CREATININE 1.02 05/05/2019 0953   CALCIUM 9.4 05/05/2019 0953    Lipid Panel     Component Value Date/Time   CHOL 200 05/05/2019 0953   TRIG 124.0 05/05/2019 0953   HDL 36.50 (L) 05/05/2019 0953   CHOLHDL 5 05/05/2019 0953   VLDL 24.8 05/05/2019 0953   LDLCALC 138 (H) 05/05/2019 0953    CBC    Component Value Date/Time   WBC 6.7 05/05/2019 0953   RBC 5.36 05/05/2019 0953   HGB 16.0 05/05/2019 0953   HCT 47.9 05/05/2019 0953   PLT 227.0 05/05/2019 0953   MCV 89.3 05/05/2019 0953   MCHC 33.5 05/05/2019 0953   RDW 13.3 05/05/2019 0953    Hgb A1C No results found for: HGBA1C         Assessment & Plan:   Low Back Pain with Left-Sided Sciatica::  MRI reviewed- no need for repeat MRI unless symptoms worsen at this time RX for Pred Taper x 9 days- avoid NSAID's OTC RX for Flexeril 5 mg TID prn- sedation caution given Encouraged stretching Ice may be helpful  Return precautions discussed Webb Silversmith, NP  This visit occurred during the SARS-CoV-2 public health emergency.  Safety protocols were in place, including screening questions prior to the visit, additional usage of staff PPE, and extensive cleaning of exam room while observing appropriate contact time as indicated for disinfecting solutions.

## 2019-12-02 ENCOUNTER — Ambulatory Visit (INDEPENDENT_AMBULATORY_CARE_PROVIDER_SITE_OTHER): Payer: 59 | Admitting: Internal Medicine

## 2019-12-02 ENCOUNTER — Other Ambulatory Visit: Payer: Self-pay

## 2019-12-02 ENCOUNTER — Encounter: Payer: Self-pay | Admitting: Internal Medicine

## 2019-12-02 VITALS — BP 122/84 | HR 78 | Temp 98.8°F | Wt 183.0 lb

## 2019-12-02 DIAGNOSIS — L989 Disorder of the skin and subcutaneous tissue, unspecified: Secondary | ICD-10-CM

## 2019-12-02 DIAGNOSIS — Z23 Encounter for immunization: Secondary | ICD-10-CM | POA: Diagnosis not present

## 2019-12-02 NOTE — Patient Instructions (Signed)
Excision of Skin Lesions, Care After This sheet gives you information about how to care for yourself after your procedure. Your health care provider may also give you more specific instructions. If you have problems or questions, contact your health care provider. What can I expect after the procedure? After your procedure, it is common to have pain or discomfort at the excision site. Follow these instructions at home: Excision care   Follow instructions from your health care provider about how to take care of your excision site. Make sure you: ? Wash your hands with soap and water before and after you change your bandage (dressing). If soap and water are not available, use hand sanitizer. ? Change your dressing as told by your health care provider. ? Leave stitches (sutures), skin glue, or adhesive strips in place. These skin closures may need to stay in place for 2 weeks or longer. If adhesive strip edges start to loosen and curl up, you may trim the loose edges. Do not remove adhesive strips completely unless your health care provider tells you to do that.  Check the excision area every day for signs of infection. Watch for: ? Redness, swelling, or pain. ? Fluid or blood. ? Warmth. ? Pus or a bad smell.  Keep the site clean, dry, and protected for at least 48 hours.  For bleeding, apply gentle but firm pressure to the area using a folded towel for 20 minutes.  Avoid high-impact exercise and activities until the sutures are removed or the area heals. General instructions  Take over-the-counter and prescription medicines only as told by your health care provider.  Follow instructions from your health care provider about how to minimize scarring. Scarring should lessen over time.  Avoid sun exposure until the area has healed. Use sunscreen to protect the area from the sun after it has healed.  Keep all follow-up visits as told by your health care provider. This is important. Contact  a health care provider if:  You have redness, swelling, or pain around your excision site.  You have fluid or blood coming from your excision site.  Your excision site feels warm to the touch.  You have pus or a bad smell coming from your excision site.  You have a fever.  You have pain that does not improve in 2-3 days after your procedure.  You notice skin irregularities or changes in how you feel (sensation). Summary  This sheet of instructions provides you with information about caring for yourself after your procedure. Contact your health care provider if you have any problems or questions.  Take over-the-counter and prescription medicines only as told by your health care provider.  Change your dressing as told by your health care provider.  Contact a health care provider if you have redness, swelling, pain, or other signs of infection around your excision site.  Keep all follow-up visits as told by your health care provider. This is important. This information is not intended to replace advice given to you by your health care provider. Make sure you discuss any questions you have with your health care provider. Document Revised: 08/21/2017 Document Reviewed: 08/21/2017 Elsevier Patient Education  2020 Elsevier Inc.  

## 2019-12-02 NOTE — Progress Notes (Signed)
Subjective:    Patient ID: Douglas Avila, male    DOB: June 07, 1970, 49 y.o.   MRN: [id number]  HPI   Pt presents to the clinic today with c/o a blister on left index finger. He noticed this 2 weeks ago. He has not noticed any itching or burning. The blister became enlarged and burst on its on with clear discharge.. Another blister formed directly under that one and is now smaller and better than it was. It was red initially and is now purple in color and is getting better. Pt denies trauma to the area or being bitten by an insect. He has been using Neosporin OTC.   Review of Systems      Past Medical History:  Diagnosis Date  . Chicken pox   . Depression   . Hyperlipidemia     Current Outpatient Medications  Medication Sig Dispense Refill  . acyclovir (ZOVIRAX) 200 MG capsule Take 1 capsule (200 mg total) by mouth 2 (two) times daily. 30 capsule 1  . clobetasol cream (TEMOVATE) 3.41 % Apply 1 application topically 2 (two) times daily. 30 g 5  . cyclobenzaprine (FLEXERIL) 5 MG tablet Take 1 tablet (5 mg total) by mouth 3 (three) times daily as needed for muscle spasms. 15 tablet 0  . predniSONE (DELTASONE) 10 MG tablet Take 3 tabs on days 1-3, take 2 tabs on days 4-6, take 1 tab on days 7-9 18 tablet 0  . vitamin C (ASCORBIC ACID) 500 MG tablet Take 500 mg by mouth daily.    . Zinc Sulfate (ZINC-220 PO) Take by mouth.     No current facility-administered medications for this visit.    No Known Allergies  Family History  Problem Relation Age of Onset  . Diabetes Father   . Cancer Neg Hx   . Stroke Neg Hx   . Hearing loss Neg Hx     Social History   Socioeconomic History  . Marital status: Divorced    Spouse name: Not on file  . Number of children: Not on file  . Years of education: Not on file  . Highest education level: Not on file  Occupational History  . Not on file  Tobacco Use  . Smoking status: Never Smoker  . Smokeless tobacco: Never Used  Substance  and Sexual Activity  . Alcohol use: Yes    Alcohol/week: 6.0 standard drinks    Types: 6 Cans of beer per week    Comment: occasional  . Drug use: No  . Sexual activity: Yes  Other Topics Concern  . Not on file  Social History Narrative  . Not on file   Social Determinants of Health   Financial Resource Strain:   . Difficulty of Paying Living Expenses: Not on file  Food Insecurity:   . Worried About Charity fundraiser in the Last Year: Not on file  . Ran Out of Food in the Last Year: Not on file  Transportation Needs:   . Lack of Transportation (Medical): Not on file  . Lack of Transportation (Non-Medical): Not on file  Physical Activity:   . Days of Exercise per Week: Not on file  . Minutes of Exercise per Session: Not on file  Stress:   . Feeling of Stress : Not on file  Social Connections:   . Frequency of Communication with Friends and Family: Not on file  . Frequency of Social Gatherings with Friends and Family: Not on file  . Attends Religious  Services: Not on file  . Active Member of Clubs or Organizations: Not on file  . Attends Archivist Meetings: Not on file  . Marital Status: Not on file  Intimate Partner Violence:   . Fear of Current or Ex-Partner: Not on file  . Emotionally Abused: Not on file  . Physically Abused: Not on file  . Sexually Abused: Not on file     Constitutional: Denies fever, malaise, fatigue, headache or abrupt weight changes.  Respiratory: Denies difficulty breathing, shortness of breath, cough or sputum production.   Cardiovascular: Denies chest pain, chest tightness, palpitations or swelling in the hands or feet.  Skin: Pt reports blister of left index finger. Denies redness, rashes, or ulcercations.  Neurological: Denies dizziness, difficulty with memory, difficulty with speech or problems with balance and coordination.    No other specific complaints in a complete review of systems (except as listed in HPI  above).  Objective:   Physical Exam BP 122/84   Pulse 78   Temp 98.8 F (37.1 C) (Temporal)   Wt 183 lb (83 kg)   SpO2 98%   BMI 25.17 kg/m   Wt Readings from Last 3 Encounters:  10/05/19 187 lb (84.8 kg)  05/05/19 188 lb (85.3 kg)  01/31/18 198 lb (89.8 kg)    General: Appears his stated age, well developed, well nourished in NAD. Skin: 0.25 cm raised fluctuant blister noted of lateral left index finger. Cardiovascular: Normal rate and rhythm.  Pulmonary/Chest: Normal effort and positive vesicular breath sounds.  Neurological: Alert and oriented.   BMET    Component Value Date/Time   NA 139 05/05/2019 0953   K 4.1 05/05/2019 0953   CL 101 05/05/2019 0953   CO2 32 05/05/2019 0953   GLUCOSE 105 (H) 05/05/2019 0953   BUN 16 05/05/2019 0953   CREATININE 1.02 05/05/2019 0953   CALCIUM 9.4 05/05/2019 0953    Lipid Panel     Component Value Date/Time   CHOL 200 05/05/2019 0953   TRIG 124.0 05/05/2019 0953   HDL 36.50 (L) 05/05/2019 0953   CHOLHDL 5 05/05/2019 0953   VLDL 24.8 05/05/2019 0953   LDLCALC 138 (H) 05/05/2019 0953    CBC    Component Value Date/Time   WBC 6.7 05/05/2019 0953   RBC 5.36 05/05/2019 0953   HGB 16.0 05/05/2019 0953   HCT 47.9 05/05/2019 0953   PLT 227.0 05/05/2019 0953   MCV 89.3 05/05/2019 0953   MCHC 33.5 05/05/2019 0953   RDW 13.3 05/05/2019 0953    Hgb A1C No results found for: HGBA1C          Assessment & Plan:  Blister of Left Index Finger.  Advised to use his Clobetasol cream mixed with Neosporin BID  If no improvement, could consider drainage No concern for infection at this time   Will monitor, return precautions discussed   Webb Silversmith, NP This visit occurred during the SARS-CoV-2 public health emergency.  Safety protocols were in place, including screening questions prior to the visit, additional usage of staff PPE, and extensive cleaning of exam room while observing appropriate contact time as indicated  for disinfecting solutions.

## 2019-12-14 ENCOUNTER — Encounter: Payer: Self-pay | Admitting: Internal Medicine

## 2019-12-14 DIAGNOSIS — L989 Disorder of the skin and subcutaneous tissue, unspecified: Secondary | ICD-10-CM

## 2019-12-20 ENCOUNTER — Ambulatory Visit (INDEPENDENT_AMBULATORY_CARE_PROVIDER_SITE_OTHER): Payer: 59

## 2019-12-20 ENCOUNTER — Ambulatory Visit
Admission: EM | Admit: 2019-12-20 | Discharge: 2019-12-20 | Disposition: A | Discharge status: Home / Self Care | Payer: 59 | Attending: Emergency Medicine | Admitting: Emergency Medicine

## 2019-12-20 ENCOUNTER — Ambulatory Visit: Payer: 59

## 2019-12-20 ENCOUNTER — Other Ambulatory Visit: Payer: Self-pay

## 2019-12-20 ENCOUNTER — Ambulatory Visit
Admission: EM | Admit: 2019-12-20 | Discharge: 2019-12-20 | Disposition: A | Discharge status: Home / Self Care | Payer: 59

## 2019-12-20 ENCOUNTER — Encounter: Payer: Self-pay | Admitting: Emergency Medicine

## 2019-12-20 DIAGNOSIS — W19XXXA Unspecified fall, initial encounter: Secondary | ICD-10-CM

## 2019-12-20 DIAGNOSIS — M25512 Pain in left shoulder: Secondary | ICD-10-CM | POA: Diagnosis not present

## 2019-12-20 MED ORDER — CYCLOBENZAPRINE HCL 5 MG PO TABS: 5.0000 mg | ORAL_TABLET | Freq: Two times a day (BID) | ORAL | 0 refills | Status: AC | PRN
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
## 2019-12-20 NOTE — Discharge Instructions (Addendum)
RICE: rest, ice, compression, elevation as needed for pain.    Pain medication:  350 mg-1000 mg of Tylenol (acetaminophen) and/or 200 mg - 800 mg of Advil (ibuprofen, Motrin) every 8 hours as needed.  May alternate between the two throughout the day as they are generally safe to take together.  DO NOT exceed more than 3000 mg of Tylenol or 3200 mg of ibuprofen in a 24 hour period as this could damage your stomach, kidneys, liver, or increase your bleeding risk.  May take muscle relaxer as needed for severe pain / spasm.  (This medication may cause you to become tired so it is important you do not drink alcohol or operate heavy machinery while on this medication.  Recommend your first dose to be taken before bedtime to monitor for side effects safely)  Important to follow up with specialist(s) below for further evaluation/management if your symptoms persist or worsen.
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
## 2020-01-04 ENCOUNTER — Encounter: Payer: Self-pay | Admitting: Internal Medicine

## 2020-02-03 ENCOUNTER — Encounter: Payer: Self-pay | Admitting: Internal Medicine

## 2020-02-03 DIAGNOSIS — A6000 Herpesviral infection of urogenital system, unspecified: Secondary | ICD-10-CM

## 2020-02-03 MED ORDER — ACYCLOVIR 200 MG PO CAPS: 200.0000 mg | ORAL_CAPSULE | Freq: Two times a day (BID) | ORAL | 1 refills | Status: DC

## 2020-04-19 ENCOUNTER — Encounter: Payer: Self-pay | Admitting: Emergency Medicine

## 2020-06-23 ENCOUNTER — Encounter: Payer: 59 | Admitting: Internal Medicine

## 2020-07-15 ENCOUNTER — Encounter: Payer: Self-pay | Admitting: Internal Medicine

## 2020-07-15 DIAGNOSIS — A6000 Herpesviral infection of urogenital system, unspecified: Secondary | ICD-10-CM

## 2020-07-15 MED ORDER — ACYCLOVIR 200 MG PO CAPS: 200.0000 mg | ORAL_CAPSULE | Freq: Two times a day (BID) | ORAL | 0 refills | Status: DC

## 2020-07-21 ENCOUNTER — Encounter: Payer: Self-pay | Admitting: Internal Medicine

## 2020-07-21 ENCOUNTER — Other Ambulatory Visit: Payer: Self-pay

## 2020-07-21 ENCOUNTER — Ambulatory Visit (INDEPENDENT_AMBULATORY_CARE_PROVIDER_SITE_OTHER): Payer: 59 | Admitting: Internal Medicine

## 2020-07-21 VITALS — BP 129/75 | HR 94 | Temp 97.8°F | Resp 17 | Ht 71.5 in | Wt 189.2 lb

## 2020-07-21 DIAGNOSIS — E782 Mixed hyperlipidemia: Secondary | ICD-10-CM | POA: Diagnosis not present

## 2020-07-21 DIAGNOSIS — N521 Erectile dysfunction due to diseases classified elsewhere: Secondary | ICD-10-CM | POA: Diagnosis not present

## 2020-07-21 DIAGNOSIS — Z0001 Encounter for general adult medical examination with abnormal findings: Secondary | ICD-10-CM | POA: Diagnosis not present

## 2020-07-21 DIAGNOSIS — Z113 Encounter for screening for infections with a predominantly sexual mode of transmission: Secondary | ICD-10-CM | POA: Diagnosis not present

## 2020-07-21 DIAGNOSIS — Z1211 Encounter for screening for malignant neoplasm of colon: Secondary | ICD-10-CM | POA: Diagnosis not present

## 2020-07-21 DIAGNOSIS — L409 Psoriasis, unspecified: Secondary | ICD-10-CM | POA: Diagnosis not present

## 2020-07-21 DIAGNOSIS — A6000 Herpesviral infection of urogenital system, unspecified: Secondary | ICD-10-CM

## 2020-07-21 NOTE — Assessment & Plan Note (Signed)
Continue Acyclovir as needed

## 2020-07-21 NOTE — Assessment & Plan Note (Signed)
He will continue treatment via online MD

## 2020-07-21 NOTE — Progress Notes (Signed)
Subjective:    Patient ID: Douglas Avila, male    DOB: 1970/03/13, 50 y.o.   MRN: [id number]  HPI  Patient presents to clinic today for his annual exam.  He is also due to follow-up chronic conditions.  HLD: His last LDL was 138, triglycerides 124, 04/2019.  He is not currently taking any cholesterol-lowering medication at this time.  He tries to consume a low-fat diet.  Genital Herpes: He denies recent outbreak.  He takes Acyclovir as needed.  Psoriasis: Managed with Clobetasol cream.  He does not follow with dermatology.  ED: He has difficulty maintaining an erection.  He gets medication online for this.  He does not follow with urology.  Flu: 11/2019 Tetanus: 04/2013 COVID: Moderna x 3 Colon screening: never Vision screening: as needed Dentist: biannually  Diet: He does eat meat. He consumes fruits and veggies daily. He does eat some fried foods. He drinks mostly water. Exercise: Body weight exercise  Review of Systems      Past Medical History:  Diagnosis Date  . Chicken pox   . Depression   . Hyperlipidemia     Current Outpatient Medications  Medication Sig Dispense Refill  . acyclovir (ZOVIRAX) 200 MG capsule Take 1 capsule (200 mg total) by mouth 2 (two) times daily. 30 capsule 0  . clobetasol cream (TEMOVATE) 2.56 % Apply 1 application topically 2 (two) times daily. 30 g 5  . vitamin C (ASCORBIC ACID) 500 MG tablet Take 500 mg by mouth daily.    . Zinc Sulfate (ZINC-220 PO) Take by mouth.     No current facility-administered medications for this visit.    No Known Allergies  Family History  Problem Relation Age of Onset  . Diabetes Father   . Cancer Neg Hx   . Stroke Neg Hx   . Hearing loss Neg Hx     Social History   Socioeconomic History  . Marital status: Single    Spouse name: Not on file  . Number of children: Not on file  . Years of education: Not on file  . Highest education level: Not on file  Occupational History  . Not on file   Tobacco Use  . Smoking status: Never Smoker  . Smokeless tobacco: Never Used  Substance and Sexual Activity  . Alcohol use: Yes    Alcohol/week: 6.0 standard drinks    Types: 6 Cans of beer per week    Comment: occasional  . Drug use: No  . Sexual activity: Yes  Other Topics Concern  . Not on file  Social History Narrative   ** Merged History Encounter **       Social Determinants of Health   Financial Resource Strain: Not on file  Food Insecurity: Not on file  Transportation Needs: Not on file  Physical Activity: Not on file  Stress: Not on file  Social Connections: Not on file  Intimate Partner Violence: Not on file     Constitutional: Denies fever, malaise, fatigue, headache or abrupt weight changes.  HEENT: Denies eye pain, eye redness, ear pain, ringing in the ears, wax buildup, runny nose, nasal congestion, bloody nose, or sore throat. Respiratory: Denies difficulty breathing, shortness of breath, cough or sputum production.   Cardiovascular: Denies chest pain, chest tightness, palpitations or swelling in the hands or feet.  Gastrointestinal: Denies abdominal pain, bloating, constipation, diarrhea or blood in the stool.  GU: Patient reports erectile dysfunction.  Denies urgency, frequency, pain with urination, burning sensation, blood  in urine, odor or discharge. Musculoskeletal: Pt reports intermittent left shoulder pain. Denies decrease in range of motion, difficulty with gait, muscle pain or joint swelling.  Skin: Denies redness, rashes, lesions or ulcercations.  Neurological: Denies dizziness, difficulty with memory, difficulty with speech or problems with balance and coordination.  Psych: Denies anxiety, depression, SI/HI.  No other specific complaints in a complete review of systems (except as listed in HPI above).  Objective:   Physical Exam  BP 129/75 (BP Location: Right Arm, Patient Position: Sitting, Cuff Size: Normal)   Pulse 94   Temp 97.8 F (36.6  C) (Temporal)   Resp 17   Ht 5' 11.5" (1.816 m)   Wt 189 lb 3.2 oz (85.8 kg)   SpO2 99%   BMI 26.02 kg/m   Wt Readings from Last 3 Encounters:  12/02/19 183 lb (83 kg)  10/05/19 187 lb (84.8 kg)  05/05/19 188 lb (85.3 kg)    General: Appears his stated age, well developed, well nourished in NAD. Skin: Warm, dry and intact. No rashes noted. HEENT: Head: normal shape and size; Eyes: sclera white, no icterus, conjunctiva pink, PERRLA and EOMs intact;  Cardiovascular: Normal rate and rhythm. S1,S2 noted.  No murmur, rubs or gallops noted. No JVD or BLE edema.  Pulmonary/Chest: Normal effort and positive vesicular breath sounds. No respiratory distress. No wheezes, rales or ronchi noted.  Abdomen: Soft and nontender. Normal bowel sounds. No distention or masses noted. Liver, spleen and kidneys non palpable. Musculoskeletal: Strength 5/5 BUE/BLE. No difficulty with gait.  Neurological: Alert and oriented. Cranial nerves II-XII grossly intact. Coordination normal.  Psychiatric: Mood and affect normal. Behavior is normal. Judgment and thought content normal.     BMET    Component Value Date/Time   NA 139 05/05/2019 0953   K 4.1 05/05/2019 0953   CL 101 05/05/2019 0953   CO2 32 05/05/2019 0953   GLUCOSE 105 (H) 05/05/2019 0953   BUN 16 05/05/2019 0953   CREATININE 1.02 05/05/2019 0953   CALCIUM 9.4 05/05/2019 0953    Lipid Panel     Component Value Date/Time   CHOL 200 05/05/2019 0953   TRIG 124.0 05/05/2019 0953   HDL 36.50 (L) 05/05/2019 0953   CHOLHDL 5 05/05/2019 0953   VLDL 24.8 05/05/2019 0953   LDLCALC 138 (H) 05/05/2019 0953    CBC    Component Value Date/Time   WBC 6.7 05/05/2019 0953   RBC 5.36 05/05/2019 0953   HGB 16.0 05/05/2019 0953   HCT 47.9 05/05/2019 0953   PLT 227.0 05/05/2019 0953   MCV 89.3 05/05/2019 0953   MCHC 33.5 05/05/2019 0953   RDW 13.3 05/05/2019 0953    Hgb A1C No results found for: HGBA1C         Assessment & Plan:    Preventative Health Maintenance:  Advised him to get a flu shot in the fall Tetanus UTD Encouraged him to get his COVID booster Referral to GI for screening colonoscopy Encouraged him to consume a balanced diet and exercise regimen Advised him to see an eye doctor and dentist annually We will check CBC, c-Met, lipid, A1c and hep C today  Screen for STD:  Will check HIV, RPR, Hep C, urine gonorrhea, chlamydia and trichomoniasis  RTC in 1 year, sooner if needed Webb Silversmith, NP This visit occurred during the SARS-CoV-2 public health emergency.  Safety protocols were in place, including screening questions prior to the visit, additional usage of staff PPE, and extensive cleaning of  exam room while observing appropriate contact time as indicated for disinfecting solutions.

## 2020-07-21 NOTE — Assessment & Plan Note (Signed)
Continue Clobetasol as needed

## 2020-07-21 NOTE — Assessment & Plan Note (Signed)
Encouraged him to consume a low fat diet Lipid profile ordered

## 2020-07-21 NOTE — Patient Instructions (Signed)

## 2020-07-28 ENCOUNTER — Other Ambulatory Visit: Payer: Self-pay

## 2020-07-28 ENCOUNTER — Other Ambulatory Visit: Payer: 59

## 2020-07-28 DIAGNOSIS — E782 Mixed hyperlipidemia: Secondary | ICD-10-CM

## 2020-07-28 DIAGNOSIS — A6000 Herpesviral infection of urogenital system, unspecified: Secondary | ICD-10-CM

## 2020-07-28 DIAGNOSIS — Z113 Encounter for screening for infections with a predominantly sexual mode of transmission: Secondary | ICD-10-CM

## 2020-07-29 LAB — LIPID PANEL
Cholesterol: 197 mg/dL (ref <200)
HDL: 37 mg/dL — ABNORMAL LOW (ref >=40)
LDL Cholesterol (Calc): 129 mg/dL (calc) — ABNORMAL HIGH
Non-HDL Cholesterol (Calc): 160 mg/dL (calc) — ABNORMAL HIGH (ref <130)
Total CHOL/HDL Ratio: 5.3 (calc) — ABNORMAL HIGH (ref <5.0)
Triglycerides: 170 mg/dL — ABNORMAL HIGH (ref <150)

## 2020-07-29 LAB — RPR: RPR Ser Ql: NONREACTIVE

## 2020-07-29 LAB — COMPREHENSIVE METABOLIC PANEL
AG Ratio: 2 (calc) (ref 1.0–2.5)
ALT: 42 U/L (ref 9–46)
AST: 29 U/L (ref 10–40)
Albumin: 4.8 g/dL (ref 3.6–5.1)
Alkaline phosphatase (APISO): 60 U/L (ref 36–130)
BUN: 15 mg/dL (ref 7–25)
CO2: 29 mmol/L (ref 20–32)
Calcium: 9.5 mg/dL (ref 8.6–10.3)
Chloride: 103 mmol/L (ref 98–110)
Creat: 0.96 mg/dL (ref 0.60–1.35)
Globulin: 2.4 g/dL (calc) (ref 1.9–3.7)
Glucose, Bld: 95 mg/dL (ref 65–99)
Potassium: 4 mmol/L (ref 3.5–5.3)
Sodium: 140 mmol/L (ref 135–146)
Total Bilirubin: 0.9 mg/dL (ref 0.2–1.2)
Total Protein: 7.2 g/dL (ref 6.1–8.1)

## 2020-07-29 LAB — CBC
HCT: 50 % (ref 38.5–50.0)
Hemoglobin: 16.5 g/dL (ref 13.2–17.1)
MCH: 30.3 pg (ref 27.0–33.0)
MCHC: 33 g/dL (ref 32.0–36.0)
MCV: 91.7 fL (ref 80.0–100.0)
MPV: 9.4 fL (ref 7.5–12.5)
Platelets: 225 10*3/uL (ref 140–400)
RBC: 5.45 10*6/uL (ref 4.20–5.80)
RDW: 13.1 % (ref 11.0–15.0)
WBC: 7.2 10*3/uL (ref 3.8–10.8)

## 2020-07-29 LAB — HEPATITIS C ANTIBODY
Hepatitis C Ab: NONREACTIVE
SIGNAL TO CUT-OFF: 0 (ref <1.00)

## 2020-07-29 LAB — HIV ANTIBODY (ROUTINE TESTING W REFLEX): HIV 1&2 Ab, 4th Generation: NONREACTIVE

## 2020-07-29 LAB — HEMOGLOBIN A1C
Hgb A1c MFr Bld: 5.6 % of total Hgb (ref <5.7)
Mean Plasma Glucose: 114 mg/dL
eAG (mmol/L): 6.3 mmol/L

## 2020-08-02 ENCOUNTER — Encounter: Payer: Self-pay | Admitting: *Deleted

## 2020-09-01 ENCOUNTER — Telehealth (INDEPENDENT_AMBULATORY_CARE_PROVIDER_SITE_OTHER): Payer: 59 | Admitting: Gastroenterology

## 2020-09-01 DIAGNOSIS — Z1211 Encounter for screening for malignant neoplasm of colon: Secondary | ICD-10-CM

## 2020-09-01 MED ORDER — NA SULFATE-K SULFATE-MG SULF 17.5-3.13-1.6 GM/177ML PO SOLN: 1.0000 | Freq: Once | ORAL | 0 refills | Status: AC

## 2020-09-01 NOTE — Progress Notes (Signed)
Gastroenterology Pre-Procedure Review  Request Date: 10/07/20 Requesting Physician: Dr. Bonna Gains  PATIENT REVIEW QUESTIONS: The patient responded to the following health history questions as indicated:    1. Are you having any GI issues? no 2. Do you have a personal history of Polyps? no 3. Do you have a family history of Colon Cancer or Polyps? no 4. Diabetes Mellitus? no 5. Joint replacements in the past 12 months?no 6. Major health problems in the past 3 months?no 7. Any artificial heart valves, MVP, or defibrillator?no    MEDICATIONS & ALLERGIES:    Patient reports the following regarding taking any anticoagulation/antiplatelet therapy:   Plavix, Coumadin, Eliquis, Xarelto, Lovenox, Pradaxa, Brilinta, or Effient? no Aspirin? no  Patient confirms/reports the following medications:  Current Outpatient Medications  Medication Sig Dispense Refill   acyclovir (ZOVIRAX) 200 MG capsule Take 1 capsule (200 mg total) by mouth 2 (two) times daily. 30 capsule 0   clobetasol cream (TEMOVATE) 3.79 % Apply 1 application topically 2 (two) times daily. 30 g 5   vitamin C (ASCORBIC ACID) 500 MG tablet Take 500 mg by mouth daily.     Zinc Sulfate (ZINC-220 PO) Take by mouth.     No current facility-administered medications for this visit.    Patient confirms/reports the following allergies:  No Known Allergies  No orders of the defined types were placed in this encounter.   AUTHORIZATION INFORMATION Primary Insurance: 1D#: Group #:  Secondary Insurance: 1D#: Group #:  SCHEDULE INFORMATION: Date: 10/07/20 Time: Location: [place]

## 2020-09-16 ENCOUNTER — Encounter: Payer: Self-pay | Admitting: Internal Medicine

## 2020-09-16 DIAGNOSIS — R9431 Abnormal electrocardiogram [ECG] [EKG]: Secondary | ICD-10-CM

## 2020-09-26 ENCOUNTER — Other Ambulatory Visit: Payer: Self-pay

## 2020-09-26 ENCOUNTER — Encounter: Payer: Self-pay | Admitting: Internal Medicine

## 2020-09-26 ENCOUNTER — Ambulatory Visit (INDEPENDENT_AMBULATORY_CARE_PROVIDER_SITE_OTHER): Payer: 59 | Admitting: Internal Medicine

## 2020-09-26 VITALS — BP 128/81 | HR 78 | Temp 97.8°F | Resp 17 | Ht 71.5 in | Wt 194.4 lb

## 2020-09-26 DIAGNOSIS — R030 Elevated blood-pressure reading, without diagnosis of hypertension: Secondary | ICD-10-CM

## 2020-09-26 DIAGNOSIS — R0602 Shortness of breath: Secondary | ICD-10-CM | POA: Diagnosis not present

## 2020-09-26 DIAGNOSIS — R002 Palpitations: Secondary | ICD-10-CM

## 2020-09-26 DIAGNOSIS — R42 Dizziness and giddiness: Secondary | ICD-10-CM | POA: Diagnosis not present

## 2020-09-26 DIAGNOSIS — R55 Syncope and collapse: Secondary | ICD-10-CM | POA: Diagnosis not present

## 2020-09-26 DIAGNOSIS — R0789 Other chest pain: Secondary | ICD-10-CM

## 2020-09-26 NOTE — Progress Notes (Signed)
Subjective:    Patient ID: Douglas Avila, male    DOB: 1970-06-26, 50 y.o.   MRN: [id number]  HPI  Pt presents to the clinic today with c/o elevated blood pressures. He has been checking his BP at home. It has been running 145-160/80-100. He has no history of HTN. He is not currently taking any antihypertensive medications. There is no ECG on file.  He also reports intermittent episode of dizziness, chest tightness, palpitations, shortness of breath and feelings of near syncope. He report this has occurred twice during impact play. He reports these episode cause some anxiety. He is able to lay down for 15-20 minutes with complete resolution of symptoms. He does have a family history of heart problems in his family.  Review of Systems  Past Medical History:  Diagnosis Date   Chicken pox    Depression    Hyperlipidemia     Current Outpatient Medications  Medication Sig Dispense Refill   acyclovir (ZOVIRAX) 200 MG capsule Take 1 capsule (200 mg total) by mouth 2 (two) times daily. 30 capsule 0   clobetasol cream (TEMOVATE) [id number] % Apply 1 application topically 2 (two) times daily. 30 g 5   vitamin C (ASCORBIC ACID) 500 MG tablet Take 500 mg by mouth daily.     Zinc Sulfate (ZINC-220 PO) Take by mouth.     No current facility-administered medications for this visit.    No Known Allergies  Family History  Problem Relation Age of Onset   Diabetes Father    Cancer Neg Hx    Stroke Neg Hx    Hearing loss Neg Hx     Social History   Socioeconomic History   Marital status: Single    Spouse name: Not on file   Number of children: Not on file   Years of education: Not on file   Highest education level: Not on file  Occupational History   Not on file  Tobacco Use   Smoking status: Never   Smokeless tobacco: Never  Vaping Use   Vaping Use: Never used  Substance and Sexual Activity   Alcohol use: Yes    Alcohol/week: 6.0 standard drinks    Types: 6 Cans of beer per week     Comment: occasional   Drug use: No   Sexual activity: Yes  Other Topics Concern   Not on file  Social History Narrative   ** Merged History Encounter **       Social Determinants of Health   Financial Resource Strain: Not on file  Food Insecurity: Not on file  Transportation Needs: Not on file  Physical Activity: Not on file  Stress: Not on file  Social Connections: Not on file  Intimate Partner Violence: Not on file     Constitutional: Denies fever, malaise, fatigue, headache or abrupt weight changes.  HEENT: Denies eye pain, eye redness, ear pain, ringing in the ears, wax buildup, runny nose, nasal congestion, bloody nose, or sore throat. Respiratory: Pt reports shortness of breath with exertion. Denies difficulty breathing, cough or sputum production.   Cardiovascular: Pt reports palpitations, chest tightness. Denies chest pain,  or swelling in the hands or feet.  Gastrointestinal: Denies abdominal pain, bloating, constipation, diarrhea or blood in the stool.  Skin: Denies redness, rashes, lesions or ulcercations.  Neurological: Pt reports intermittent dizziness. Denies difficulty with memory, difficulty with speech or problems with balance and coordination.  Psych: Pt reports anxiety. Denies depression, SI/HI.  No other specific complaints in  a complete review of systems (except as listed in HPI above).     Objective:   Physical Exam  BP 128/81 (BP Location: Right Arm, Patient Position: Sitting, Cuff Size: Normal)   Pulse 78   Temp 97.8 F (36.6 C) (Temporal)   Resp 17   Ht 5' 11.5" (1.816 m)   Wt 194 lb 6.4 oz (88.2 kg)   SpO2 100%   BMI 26.74 kg/m   Wt Readings from Last 3 Encounters:  07/21/20 189 lb 3.2 oz (85.8 kg)  12/02/19 183 lb (83 kg)  10/05/19 187 lb (84.8 kg)    General: Appears his stated age, well developed, well nourished in NAD. Skin: Warm, dry and intact.  HEENT: Head: normal shape and size; Eyes: sclera white and EOMs intact;   Cardiovascular: Normal rate and rhythm. S1,S2 noted.  No murmur, rubs or gallops noted.  Pulmonary/Chest: Normal effort and positive vesicular breath sounds. No respiratory distress. No wheezes, rales or ronchi noted.  Neurological: Alert and oriented.  Psychiatric: Mood and affect normal. Behavior is normal. Judgment and thought content normal.     BMET    Component Value Date/Time   NA 140 07/28/2020 0914   K 4.0 07/28/2020 0914   CL 103 07/28/2020 0914   CO2 29 07/28/2020 0914   GLUCOSE 95 07/28/2020 0914   BUN 15 07/28/2020 0914   CREATININE 0.96 07/28/2020 0914   CALCIUM 9.5 07/28/2020 0914    Lipid Panel     Component Value Date/Time   CHOL 197 07/28/2020 0914   TRIG 170 (H) 07/28/2020 0914   HDL 37 (L) 07/28/2020 0914   CHOLHDL 5.3 (H) 07/28/2020 0914   VLDL 24.8 05/05/2019 0953   LDLCALC 129 (H) 07/28/2020 0914    CBC    Component Value Date/Time   WBC 7.2 07/28/2020 0914   RBC 5.45 07/28/2020 0914   HGB 16.5 07/28/2020 0914   HCT 50.0 07/28/2020 0914   PLT 225 07/28/2020 0914   MCV 91.7 07/28/2020 0914   MCH 30.3 07/28/2020 0914   MCHC 33.0 07/28/2020 0914   RDW 13.1 07/28/2020 0914    Hgb A1C Lab Results  Component Value Date   HGBA1C 5.6 07/28/2020           Assessment & Plan:   Dizziness, Palpitations, Chest Tightness, Dyspnea on Exertion, Near Syncope:  Indication for ECG: palpitations Interpretation of ECG :NSR, inverted t waves in 2 leads Comparison of ECG: none Referral to cardiology for further evaluation of symptoms  Elevated Blood Pressure:  BP normal today Will monitor at this time  Return precautions discussed  Webb Silversmith, NP This visit occurred during the SARS-CoV-2 public health emergency.  Safety protocols were in place, including screening questions prior to the visit, additional usage of staff PPE, and extensive cleaning of exam room while observing appropriate contact time as indicated for disinfecting solutions.

## 2020-09-27 NOTE — Patient Instructions (Signed)
Palpitations Palpitations are feelings that your heartbeat is not normal. Your heartbeat may feel like it is: Uneven. Faster than normal. Fluttering. Skipping a beat. This is usually not a serious problem. In some cases, you may need tests torule out any serious problems. Follow these instructions at home: Pay attention to any changes in your condition. Take these actions to helpmanage your symptoms: Eating and drinking Avoid: Coffee, tea, soft drinks, and energy drinks. Chocolate. Alcohol. Diet pills. Lifestyle  Try to lower your stress. These things can help you relax: Yoga. Deep breathing and meditation. Exercise. Using words and images to create positive thoughts (guided imagery). Using your mind to control things in your body (biofeedback). Do not use drugs. Get plenty of rest and sleep. Keep a regular bed time.  General instructions  Take over-the-counter and prescription medicines only as told by your doctor. Do not use any products that contain nicotine or tobacco, such as cigarettes and e-cigarettes. If you need help quitting, ask your doctor. Keep all follow-up visits as told by your doctor. This is important. You may need more tests if palpitations do not go away or get worse.  Contact a doctor if: Your symptoms last more than 24 hours. Your symptoms occur more often. Get help right away if you: Have chest pain. Feel short of breath. Have a very bad headache. Feel dizzy. Pass out (faint). Summary Palpitations are feelings that your heartbeat is uneven or faster than normal. It may feel like your heart is fluttering or skipping a beat. Avoid food and drinks that may cause palpitations. These include caffeine, chocolate, and alcohol. Try to lower your stress. Do not smoke or use drugs. Get help right away if you faint or have chest pain, shortness of breath, a severe headache, or dizziness. This information is not intended to replace advice given to you by your  health care provider. Make sure you discuss any questions you have with your healthcare provider. Document Revised: 03/27/2017 Document Reviewed: 03/27/2017 Elsevier Patient Education  2022 Elsevier Inc.  

## 2020-10-03 ENCOUNTER — Telehealth: Payer: Self-pay | Admitting: Gastroenterology

## 2020-10-03 NOTE — Telephone Encounter (Signed)
Left detailed msg on VM per HIPAA  

## 2020-10-03 NOTE — Telephone Encounter (Signed)
Patient called and needs to cancel his procedure for this Friday 10/07/20. He is having cardiac problems and his pcp is sending him to a cardiologist.

## 2020-10-03 NOTE — Telephone Encounter (Signed)
Pt lmovm requesting to cancel procedure, has been canceled

## 2020-10-07 ENCOUNTER — Ambulatory Visit: Admit: 2020-10-07 | Payer: 59 | Admitting: Gastroenterology

## 2020-10-07 SURGERY — COLONOSCOPY WITH PROPOFOL
Anesthesia: General

## 2020-11-01 ENCOUNTER — Other Ambulatory Visit: Payer: Self-pay

## 2020-11-15 ENCOUNTER — Other Ambulatory Visit: Payer: Self-pay | Admitting: Internal Medicine

## 2020-11-15 DIAGNOSIS — A6000 Herpesviral infection of urogenital system, unspecified: Secondary | ICD-10-CM

## 2020-11-17 ENCOUNTER — Ambulatory Visit (INDEPENDENT_AMBULATORY_CARE_PROVIDER_SITE_OTHER): Payer: 59 | Admitting: Cardiology

## 2020-11-17 ENCOUNTER — Encounter: Payer: Self-pay | Admitting: Cardiology

## 2020-11-17 ENCOUNTER — Other Ambulatory Visit: Payer: Self-pay

## 2020-11-17 VITALS — BP 134/76 | HR 71 | Ht 72.0 in | Wt 189.0 lb

## 2020-11-17 DIAGNOSIS — E782 Mixed hyperlipidemia: Secondary | ICD-10-CM

## 2020-11-17 DIAGNOSIS — R0602 Shortness of breath: Secondary | ICD-10-CM

## 2020-11-17 DIAGNOSIS — R9431 Abnormal electrocardiogram [ECG] [EKG]: Secondary | ICD-10-CM | POA: Diagnosis not present

## 2020-11-17 MED ORDER — ACYCLOVIR 200 MG PO CAPS: 200.0000 mg | ORAL_CAPSULE | Freq: Two times a day (BID) | ORAL | 0 refills | Status: DC

## 2020-11-17 NOTE — Progress Notes (Signed)
Cardiology Office Note:    Date:  11/17/2020   ID:  Douglas Avila, DOB 08/15/1970, MRN [id number]  PCP:  Jearld Fenton, NP   [facility] Providers Cardiologist:  None     Referring MD: Jearld Fenton, NP   Chief Complaint  Patient presents with   New Patient (Initial Visit)    Referred by PCP for Abnormal EKG. Meds reviewed verbally with patient.    Douglas Avila is a 50 y.o. male who is being seen today for the evaluation of EKG abnormality at the request of Jearld Fenton, NP.   History of Present Illness:    Douglas Avila is a 50 y.o. male with no significant past medical history who presents due to EKG abnormality and shortness of breath.  Patient saw her primary care provider last month due to shortness of breath after sexual intercourse.  Denies any prior symptoms, has not had any symptoms since.  Able to walk and perform all activities of daily living without chest pains or shortness of breath.  EKG obtained at PCPs visit showed showed T wave inversions in 2 leads.  EKG not available for my review.  He otherwise feels well, has no concerns at this time.  Denies any family history of MIs, does not smoke, blood pressure is normal.  Past Medical History:  Diagnosis Date   Chicken pox    Depression    Hyperlipidemia     Past Surgical History:  Procedure Laterality Date   LUMBAR [place] SURGERY  2016    Current Medications: Current Meds  Medication Sig   acyclovir (ZOVIRAX) 200 MG capsule Take 1 capsule (200 mg total) by mouth 2 (two) times daily.   clobetasol cream (TEMOVATE) 1.75 % Apply 1 application topically 2 (two) times daily.   vitamin C (ASCORBIC ACID) 500 MG tablet Take 500 mg by mouth daily.   Zinc Sulfate (ZINC-220 PO) Take by mouth.     Allergies:   Patient has no known allergies.   Social History   Socioeconomic History   Marital status: Single    Spouse name: Not on file   Number of children: Not on file   Years of education: Not  on file   Highest education level: Not on file  Occupational History   Not on file  Tobacco Use   Smoking status: Never   Smokeless tobacco: Never  Vaping Use   Vaping Use: Never used  Substance and Sexual Activity   Alcohol use: Yes    Alcohol/week: 3.0 standard drinks    Types: 3 Cans of beer per week    Comment: occasional   Drug use: No   Sexual activity: Yes  Other Topics Concern   Not on file  Social History Narrative   ** Merged History Encounter **       Social Determinants of Health   Financial Resource Strain: Not on file  Food Insecurity: Not on file  Transportation Needs: Not on file  Physical Activity: Not on file  Stress: Not on file  Social Connections: Not on file     Family History: The patient's family history includes Diabetes in his father. There is no history of Cancer, Stroke, or Hearing loss.  ROS:   Please see the history of present illness.     All other systems reviewed and are negative.  EKGs/Labs/Other Studies Reviewed:    The following studies were reviewed today:   EKG:  EKG is  ordered today.  The ekg ordered today demonstrates normal sinus rhythm, normal ECG  Recent Labs: 07/28/2020: ALT 42; BUN 15; Creat 0.96; Hemoglobin 16.5; Platelets 225; Potassium 4.0; Sodium 140  Recent Lipid Panel    Component Value Date/Time   CHOL 197 07/28/2020 0914   TRIG 170 (H) 07/28/2020 0914   HDL 37 (L) 07/28/2020 0914   CHOLHDL 5.3 (H) 07/28/2020 0914   VLDL 24.8 05/05/2019 0953   LDLCALC 129 (H) 07/28/2020 0914     Risk Assessment/Calculations:          Physical Exam:    VS:  BP 134/76 (BP Location: Left Arm, Patient Position: Sitting, Cuff Size: Normal)   Pulse 71   Ht 6' (1.829 m)   Wt 189 lb (85.7 kg)   SpO2 96%   BMI 25.63 kg/m     Wt Readings from Last 3 Encounters:  11/17/20 189 lb (85.7 kg)  09/26/20 194 lb 6.4 oz (88.2 kg)  07/21/20 189 lb 3.2 oz (85.8 kg)     GEN:  Well nourished, well developed in no acute  distress HEENT: Normal NECK: No JVD; No carotid bruits LYMPHATICS: No lymphadenopathy CARDIAC: RRR, no murmurs, rubs, gallops RESPIRATORY:  Clear to auscultation without rales, wheezing or rhonchi  ABDOMEN: Soft, non-tender, non-distended MUSCULOSKELETAL:  No edema; No deformity  SKIN: Warm and dry NEUROLOGIC:  Alert and oriented x 3 PSYCHIATRIC:  Normal affect   ASSESSMENT:    1. SOB (shortness of breath)   2. EKG abnormality   3. Mixed hyperlipidemia    PLAN:    In order of problems listed above:  Shortness of breath occurring once during sexual intercourse.  No additional shortness of breath noted since then, no prior symptoms.  Symptoms are consistent with angina, continue to monitor, if symptoms recur, may consider additional testing.  Patient currently asymptomatic, no chest pain. EKG abnormality at PCPs office.  Not available for my review.  EKG obtained today in the office is normal.  Patient is asymptomatic.  Reassured, monitor symptoms. Hyperlipidemia, 10-year ASCVD risk 4.6%.  Patient not in statin benefit group.  Heart healthy, low-cholesterol diet recommended.  Follow-up as needed    Medication Adjustments/Labs and Tests Ordered: Current medicines are reviewed at length with the patient today.  Concerns regarding medicines are outlined above.  Orders Placed This Encounter  Procedures   EKG 12-Lead   No orders of the defined types were placed in this encounter.   Patient Instructions  Medication Instructions:  Your physician recommends that you continue on your current medications as directed. Please refer to the Current Medication list given to you today.  *If you need a refill on your cardiac medications before your next appointment, please call your pharmacy*   Lab Work: None ordered If you have labs (blood work) drawn today and your tests are completely normal, you will receive your results only by: [place] (if you have MyChart) OR A paper copy  in the mail If you have any lab test that is abnormal or we need to change your treatment, we will call you to review the results.   Testing/Procedures: None ordered   Follow-Up: At [facility], you and your health needs are our priority.  As part of our continuing mission to provide you with exceptional heart care, we have created designated Provider Care Teams.  These Care Teams include your primary Cardiologist (physician) and Advanced Practice Providers (APPs -  Physician Assistants and Nurse Practitioners) who all work together to provide you with the  care you need, when you need it.  We recommend signing up for the patient portal called "MyChart".  Sign up information is provided on this After Visit Summary.  MyChart is used to connect with patients for Virtual Visits (Telemedicine).  Patients are able to view lab/test results, encounter notes, upcoming appointments, etc.  Non-urgent messages can be sent to your provider as well.   To learn more about what you can do with MyChart, go to [email or web address].    Your next appointment:   Follow up as needed   The format for your next appointment:   In Person  Provider:   Kate Sable, MD   Other Instructions    Signed, Kate Sable, MD  11/17/2020 10:58 AM    [place]

## 2020-11-17 NOTE — Patient Instructions (Signed)

## 2021-02-25 ENCOUNTER — Other Ambulatory Visit: Payer: Self-pay | Admitting: Internal Medicine

## 2021-02-25 ENCOUNTER — Encounter: Payer: Self-pay | Admitting: Internal Medicine

## 2021-02-25 DIAGNOSIS — A6000 Herpesviral infection of urogenital system, unspecified: Secondary | ICD-10-CM

## 2021-02-28 MED ORDER — CLOBETASOL PROPIONATE 0.05 % EX CREA: 1.0000 "application " | TOPICAL_CREAM | Freq: Two times a day (BID) | CUTANEOUS | 5 refills | Status: AC

## 2021-03-01 MED ORDER — ACYCLOVIR 200 MG PO CAPS: 200.0000 mg | ORAL_CAPSULE | Freq: Two times a day (BID) | ORAL | 2 refills | Status: DC

## 2021-05-05 ENCOUNTER — Encounter: Payer: Self-pay | Admitting: Internal Medicine

## 2021-07-12 ENCOUNTER — Encounter: Payer: Self-pay | Admitting: Internal Medicine

## 2021-07-12 ENCOUNTER — Ambulatory Visit (INDEPENDENT_AMBULATORY_CARE_PROVIDER_SITE_OTHER): Payer: 59 | Admitting: Internal Medicine

## 2021-07-12 VITALS — BP 130/76 | HR 82 | Temp 97.7°F | Ht 72.0 in | Wt 196.0 lb

## 2021-07-12 DIAGNOSIS — Z6826 Body mass index (BMI) 26.0-26.9, adult: Secondary | ICD-10-CM

## 2021-07-12 DIAGNOSIS — E782 Mixed hyperlipidemia: Secondary | ICD-10-CM | POA: Diagnosis not present

## 2021-07-12 DIAGNOSIS — E663 Overweight: Secondary | ICD-10-CM | POA: Insufficient documentation

## 2021-07-12 DIAGNOSIS — Z125 Encounter for screening for malignant neoplasm of prostate: Secondary | ICD-10-CM

## 2021-07-12 DIAGNOSIS — L409 Psoriasis, unspecified: Secondary | ICD-10-CM | POA: Diagnosis not present

## 2021-07-12 DIAGNOSIS — N521 Erectile dysfunction due to diseases classified elsewhere: Secondary | ICD-10-CM

## 2021-07-12 DIAGNOSIS — Z1211 Encounter for screening for malignant neoplasm of colon: Secondary | ICD-10-CM

## 2021-07-12 DIAGNOSIS — A6001 Herpesviral infection of penis: Secondary | ICD-10-CM

## 2021-07-12 NOTE — Assessment & Plan Note (Signed)
Currently not an issue off meds 

## 2021-07-12 NOTE — Assessment & Plan Note (Signed)
Encourage diet and exercise for weight loss 

## 2021-07-12 NOTE — Progress Notes (Signed)
? ?Subjective:  ? ? Patient ID: Douglas Avila, male    DOB: 1970-04-07, 51 y.o.   MRN: [id number] ? ?HPI ? ?Patient presents to clinic today for follow-up of chronic conditions. ? ?Psoriasis: He uses Clobetasol cream as needed.  He is not following with dermatology. ? ?ED: Currently not an issue. He is not currently taking any medication for this.  He does not follow with urology. ? ?Genital Herpes: He reports recent outbreak.  He takes Acyclovir as needed for flares. ? ?HLD: His last LDL was 129, triglycerides 170, 07/2020.  He is not taking any cholesterol-lowering medication at this time.  He tries to consume a low-fat diet. ? ?Review of Systems ? ?   ?Past Medical History:  ?Diagnosis Date  ? Chicken pox   ? Depression   ? Hyperlipidemia   ? ? ?Current Outpatient Medications  ?Medication Sig Dispense Refill  ? acyclovir (ZOVIRAX) 200 MG capsule Take 1 capsule (200 mg total) by mouth 2 (two) times daily. 30 capsule 2  ? clobetasol cream (TEMOVATE) 3.15 % Apply 1 application topically 2 (two) times daily. 30 g 5  ? vitamin C (ASCORBIC ACID) 500 MG tablet Take 500 mg by mouth daily.    ? Zinc Sulfate (ZINC-220 PO) Take by mouth.    ? ?No current facility-administered medications for this visit.  ? ? ?No Known Allergies ? ?Family History  ?Problem Relation Age of Onset  ? Diabetes Father   ? Cancer Neg Hx   ? Stroke Neg Hx   ? Hearing loss Neg Hx   ? ? ?Social History  ? ?Socioeconomic History  ? Marital status: Single  ?  Spouse name: Not on file  ? Number of children: Not on file  ? Years of education: Not on file  ? Highest education level: Not on file  ?Occupational History  ? Not on file  ?Tobacco Use  ? Smoking status: Never  ? Smokeless tobacco: Never  ?Vaping Use  ? Vaping Use: Never used  ?Substance and Sexual Activity  ? Alcohol use: Yes  ?  Alcohol/week: 3.0 standard drinks  ?  Types: 3 Cans of beer per week  ?  Comment: occasional  ? Drug use: No  ? Sexual activity: Yes  ?Other Topics Concern  ? Not on  file  ?Social History Narrative  ? ** Merged History Encounter **  ?    ? ?Social Determinants of Health  ? ?Financial Resource Strain: Not on file  ?Food Insecurity: Not on file  ?Transportation Needs: Not on file  ?Physical Activity: Not on file  ?Stress: Not on file  ?Social Connections: Not on file  ?Intimate Partner Violence: Not on file  ? ? ? ?Constitutional: Denies fever, malaise, fatigue, headache or abrupt weight changes.  ?HEENT: Denies eye pain, eye redness, ear pain, ringing in the ears, wax buildup, runny nose, nasal congestion, bloody nose, or sore throat. ?Respiratory: Denies difficulty breathing, shortness of breath, cough or sputum production.   ?Cardiovascular: Denies chest pain, chest tightness, palpitations or swelling in the hands or feet.  ?Gastrointestinal: Denies abdominal pain, bloating, constipation, diarrhea or blood in the stool.  ?GU: Denies urgency, frequency, pain with urination, burning sensation, blood in urine, odor or discharge. ?Musculoskeletal: Denies decrease in range of motion, difficulty with gait, muscle pain or joint pain and swelling.  ?Skin: Denies redness, rashes, lesions or ulcercations.  ?Neurological: Denies dizziness, difficulty with memory, difficulty with speech or problems with balance and coordination.  ?Psych:  Denies anxiety, depression, SI/HI. ? ?No other specific complaints in a complete review of systems (except as listed in HPI above). ? ?Objective:  ? Physical Exam ? ?BP 130/76 (BP Location: Left Arm, Patient Position: Sitting, Cuff Size: Large)   Pulse 82   Temp 97.7 ?F (36.5 ?C) (Temporal)   Ht 6' (1.829 m)   Wt 196 lb (88.9 kg)   SpO2 98%   BMI 26.58 kg/m?  ? ?Wt Readings from Last 3 Encounters:  ?11/17/20 189 lb (85.7 kg)  ?09/26/20 194 lb 6.4 oz (88.2 kg)  ?07/21/20 189 lb 3.2 oz (85.8 kg)  ? ? ?General: Appears his stated age, overweight in NAD. ?Skin: Warm, dry and intact.  ?HEENT: Head: normal shape and size; Eyes: sclera white, no icterus,  conjunctiva pink, PERRLA and EOMs intact;  ?Neck:  Neck supple, trachea midline. No masses, lumps or thyromegaly present.  ?Cardiovascular: Normal rate and rhythm. S1,S2 noted.  No murmur, rubs or gallops noted. No JVD or BLE edema. No carotid bruits noted. ?Pulmonary/Chest: Normal effort and positive vesicular breath sounds. No respiratory distress. No wheezes, rales or ronchi noted.  ?Abdomen: Soft and nontender. Normal bowel sounds.  ?Musculoskeletal: Strength 5/5 BUE/BLE.  No difficulty with gait.  ?Neurological: Alert and oriented. Cranial nerves II-XII grossly intact. Coordination normal.  ?Psychiatric: Mood and affect normal. Behavior is normal. Judgment and thought content normal.  ? ? ? ?BMET ?   ?Component Value Date/Time  ? NA 140 07/28/2020 0914  ? K 4.0 07/28/2020 0914  ? CL 103 07/28/2020 0914  ? CO2 29 07/28/2020 0914  ? GLUCOSE 95 07/28/2020 0914  ? BUN 15 07/28/2020 0914  ? CREATININE 0.96 07/28/2020 0914  ? CALCIUM 9.5 07/28/2020 0914  ? ? ?Lipid Panel  ?   ?Component Value Date/Time  ? CHOL 197 07/28/2020 0914  ? TRIG 170 (H) 07/28/2020 0914  ? HDL 37 (L) 07/28/2020 0914  ? CHOLHDL 5.3 (H) 07/28/2020 0914  ? VLDL 24.8 05/05/2019 0953  ? LDLCALC 129 (H) 07/28/2020 0914  ? ? ?CBC ?   ?Component Value Date/Time  ? WBC 7.2 07/28/2020 0914  ? RBC 5.45 07/28/2020 0914  ? HGB 16.5 07/28/2020 0914  ? HCT 50.0 07/28/2020 0914  ? PLT 225 07/28/2020 0914  ? MCV 91.7 07/28/2020 0914  ? MCH 30.3 07/28/2020 0914  ? MCHC 33.0 07/28/2020 0914  ? RDW 13.1 07/28/2020 0914  ? ? ?Hgb A1C ?Lab Results  ?Component Value Date  ? HGBA1C 5.6 07/28/2020  ? ? ? ? ? ? ?   ?Assessment & Plan:  ? ? ? ?Webb Silversmith, NP ? ?

## 2021-07-12 NOTE — Assessment & Plan Note (Signed)
Continue Clobetasol as needed ?

## 2021-07-12 NOTE — Patient Instructions (Signed)
Heart-Healthy Eating Plan Many factors influence your heart (coronary) health, including eating and exercise habits. Coronary risk increases with abnormal blood fat (lipid) levels. Heart-healthy meal planning includes limiting unhealthy fats, increasing healthy fats, and making other diet and lifestyle changes. What is my plan? Your health care provider may recommend that you: Limit your fat intake to _________% or less of your total calories each day. Limit your saturated fat intake to _________% or less of your total calories each day. Limit the amount of cholesterol in your diet to less than _________ mg per day. What are tips for following this plan? Cooking Cook foods using methods other than frying. Baking, boiling, grilling, and broiling are all good options. Other ways to reduce fat include: Removing the skin from poultry. Removing all visible fats from meats. Steaming vegetables in water or broth. Meal planning  At meals, imagine dividing your plate into fourths: Fill one-half of your plate with vegetables and green salads. Fill one-fourth of your plate with whole grains. Fill one-fourth of your plate with lean protein foods. Eat 4-5 servings of vegetables per day. One serving equals 1 cup raw or cooked vegetable, or 2 cups raw leafy greens. Eat 4-5 servings of fruit per day. One serving equals 1 medium whole fruit,  cup dried fruit,  cup fresh, frozen, or canned fruit, or  cup 100% fruit juice. Eat more foods that contain soluble fiber. Examples include apples, broccoli, carrots, beans, peas, and barley. Aim to get 25-30 g of fiber per day. Increase your consumption of legumes, nuts, and seeds to 4-5 servings per week. One serving of dried beans or legumes equals  cup cooked, 1 serving of nuts is  cup, and 1 serving of seeds equals 1 tablespoon. Fats Choose healthy fats more often. Choose monounsaturated and polyunsaturated fats, such as olive and canola oils, flaxseeds,  walnuts, almonds, and seeds. Eat more omega-3 fats. Choose salmon, mackerel, sardines, tuna, flaxseed oil, and ground flaxseeds. Aim to eat fish at least 2 times each week. Check food labels carefully to identify foods with trans fats or high amounts of saturated fat. Limit saturated fats. These are found in animal products, such as meats, butter, and cream. Plant sources of saturated fats include palm oil, palm kernel oil, and coconut oil. Avoid foods with partially hydrogenated oils in them. These contain trans fats. Examples are stick margarine, some tub margarines, cookies, crackers, and other baked goods. Avoid fried foods. General information Eat more home-cooked food and less restaurant, buffet, and fast food. Limit or avoid alcohol. Limit foods that are high in starch and sugar. Lose weight if you are overweight. Losing just 5-10% of your body weight can help your overall health and prevent diseases such as diabetes and heart disease. Monitor your salt (sodium) intake, especially if you have high blood pressure. Talk with your health care provider about your sodium intake. Try to incorporate more vegetarian meals weekly. What foods can I eat? Fruits All fresh, canned (in natural juice), or frozen fruits. Vegetables Fresh or frozen vegetables (raw, steamed, roasted, or grilled). Green salads. Grains Most grains. Choose whole wheat and whole grains most of the time. Rice and pasta, including brown rice and pastas made with whole wheat. Meats and other proteins Lean, well-trimmed beef, veal, pork, and lamb. Chicken and turkey without skin. All fish and shellfish. Wild duck, rabbit, pheasant, and venison. Egg whites or low-cholesterol egg substitutes. Dried beans, peas, lentils, and tofu. Seeds and most nuts. Dairy Low-fat or nonfat cheeses,   including ricotta and mozzarella. Skim or 1% milk (liquid, powdered, or evaporated). Buttermilk made with low-fat milk. Nonfat or low-fat  yogurt. Fats and oils Non-hydrogenated (trans-free) margarines. Vegetable oils, including soybean, sesame, sunflower, olive, peanut, safflower, corn, canola, and cottonseed. Salad dressings or mayonnaise made with a vegetable oil. Beverages Water (mineral or sparkling). Coffee and tea. Diet carbonated beverages. Sweets and desserts Sherbet, gelatin, and fruit ice. Small amounts of dark chocolate. Limit all sweets and desserts. Seasonings and condiments All seasonings and condiments. The items listed above may not be a complete list of foods and beverages you can eat. Contact a dietitian for more options. What foods are not recommended? Fruits Canned fruit in heavy syrup. Fruit in cream or butter sauce. Fried fruit. Limit coconut. Vegetables Vegetables cooked in cheese, cream, or butter sauce. Fried vegetables. Grains Breads made with saturated or trans fats, oils, or whole milk. Croissants. Sweet rolls. Donuts. High-fat crackers, such as cheese crackers. Meats and other proteins Fatty meats, such as hot dogs, ribs, sausage, bacon, rib-eye roast or steak. High-fat deli meats, such as salami and bologna. Caviar. Domestic duck and goose. Organ meats, such as liver. Dairy Cream, sour cream, cream cheese, and creamed cottage cheese. Whole-milk cheeses. Whole or 2% milk (liquid, evaporated, or condensed). Whole buttermilk. Cream sauce or high-fat cheese sauce. Whole-milk yogurt. Fats and oils Meat fat, or shortening. Cocoa butter, hydrogenated oils, palm oil, coconut oil, palm kernel oil. Solid fats and shortenings, including bacon fat, salt pork, lard, and butter. Nondairy cream substitutes. Salad dressings with cheese or sour cream. Beverages Regular sodas and any drinks with added sugar. Sweets and desserts Frosting. Pudding. Cookies. Cakes. Pies. Milk chocolate or white chocolate. Buttered syrups. Full-fat ice cream or ice cream drinks. The items listed above may not be a complete list of  foods and beverages to avoid. Contact a dietitian for more information. Summary Heart-healthy meal planning includes limiting unhealthy fats, increasing healthy fats, and making other diet and lifestyle changes. Lose weight if you are overweight. Losing just 5-10% of your body weight can help your overall health and prevent diseases such as diabetes and heart disease. Focus on eating a balance of foods, including fruits and vegetables, low-fat or nonfat dairy, lean protein, nuts and legumes, whole grains, and heart-healthy oils and fats. This information is not intended to replace advice given to you by your health care provider. Make sure you discuss any questions you have with your health care provider. Document Revised: 06/23/2020 Document Reviewed: 06/23/2020 Elsevier Patient Education  2022 Elsevier Inc.  

## 2021-07-12 NOTE — Assessment & Plan Note (Signed)
He has Acyclovir to use as needed for outbreaks ?

## 2021-07-12 NOTE — Assessment & Plan Note (Signed)
C-Met and lipid profile today ?Encouraged him to consume a low-fat diet ?

## 2021-07-13 ENCOUNTER — Telehealth: Payer: Self-pay

## 2021-07-13 ENCOUNTER — Other Ambulatory Visit: Payer: Self-pay

## 2021-07-13 ENCOUNTER — Encounter: Payer: Self-pay | Admitting: Internal Medicine

## 2021-07-13 DIAGNOSIS — Z1211 Encounter for screening for malignant neoplasm of colon: Secondary | ICD-10-CM

## 2021-07-13 DIAGNOSIS — E7800 Pure hypercholesterolemia, unspecified: Secondary | ICD-10-CM

## 2021-07-13 LAB — COMPLETE METABOLIC PANEL WITH GFR
AG Ratio: 1.8 (calc) (ref 1.0–2.5)
ALT: 67 U/L — ABNORMAL HIGH (ref 9–46)
AST: 36 U/L — ABNORMAL HIGH (ref 10–35)
Albumin: 4.5 g/dL (ref 3.6–5.1)
Alkaline phosphatase (APISO): 73 U/L (ref 35–144)
BUN: 12 mg/dL (ref 7–25)
CO2: 26 mmol/L (ref 20–32)
Calcium: 9.3 mg/dL (ref 8.6–10.3)
Chloride: 106 mmol/L (ref 98–110)
Creat: 0.9 mg/dL (ref 0.70–1.30)
Globulin: 2.5 g/dL (calc) (ref 1.9–3.7)
Glucose, Bld: 107 mg/dL — ABNORMAL HIGH (ref 65–99)
Potassium: 4.6 mmol/L (ref 3.5–5.3)
Sodium: 142 mmol/L (ref 135–146)
Total Bilirubin: 0.3 mg/dL (ref 0.2–1.2)
Total Protein: 7 g/dL (ref 6.1–8.1)
eGFR: 104 mL/min/{1.73_m2} (ref >=60)

## 2021-07-13 LAB — HEMOGLOBIN A1C
Hgb A1c MFr Bld: 5.6 % of total Hgb (ref <5.7)
Mean Plasma Glucose: 114 mg/dL
eAG (mmol/L): 6.3 mmol/L

## 2021-07-13 LAB — CBC
HCT: 49 % (ref 38.5–50.0)
Hemoglobin: 16.2 g/dL (ref 13.2–17.1)
MCH: 30.2 pg (ref 27.0–33.0)
MCHC: 33.1 g/dL (ref 32.0–36.0)
MCV: 91.2 fL (ref 80.0–100.0)
MPV: 10 fL (ref 7.5–12.5)
Platelets: 224 10*3/uL (ref 140–400)
RBC: 5.37 10*6/uL (ref 4.20–5.80)
RDW: 13.5 % (ref 11.0–15.0)
WBC: 6 10*3/uL (ref 3.8–10.8)

## 2021-07-13 LAB — LIPID PANEL
Cholesterol: 203 mg/dL — ABNORMAL HIGH (ref <200)
HDL: 39 mg/dL — ABNORMAL LOW (ref >=40)
LDL Cholesterol (Calc): 137 mg/dL (calc) — ABNORMAL HIGH
Non-HDL Cholesterol (Calc): 164 mg/dL (calc) — ABNORMAL HIGH (ref <130)
Total CHOL/HDL Ratio: 5.2 (calc) — ABNORMAL HIGH (ref <5.0)
Triglycerides: 138 mg/dL (ref <150)

## 2021-07-13 LAB — PSA: PSA: 0.61 ng/mL (ref <=4.00)

## 2021-07-13 MED ORDER — NA SULFATE-K SULFATE-MG SULF 17.5-3.13-1.6 GM/177ML PO SOLN: 1.0000 | Freq: Once | ORAL | 0 refills | Status: AC

## 2021-07-13 MED ORDER — LOVASTATIN 10 MG PO TABS: 10.0000 mg | ORAL_TABLET | Freq: Every day | ORAL | 0 refills | Status: DC

## 2021-07-13 NOTE — Telephone Encounter (Signed)
Gastroenterology Pre-Procedure Review  Request Date: 08/18/21 Requesting Physician: Dr. Marius Ditch  PATIENT REVIEW QUESTIONS: The patient responded to the following health history questions as indicated:    1. Are you having any GI issues? no 2. Do you have a personal history of Polyps? no 3. Do you have a family history of Colon Cancer or Polyps? no 4. Diabetes Mellitus? no 5. Joint replacements in the past 12 months?no 6. Major health problems in the past 3 months?no 7. Any artificial heart valves, MVP, or defibrillator?no    MEDICATIONS & ALLERGIES:    Patient reports the following regarding taking any anticoagulation/antiplatelet therapy:   Plavix, Coumadin, Eliquis, Xarelto, Lovenox, Pradaxa, Brilinta, or Effient? no Aspirin? no  Patient confirms/reports the following medications:  Current Outpatient Medications  Medication Sig Dispense Refill   acyclovir (ZOVIRAX) 200 MG capsule Take 1 capsule (200 mg total) by mouth 2 (two) times daily. 30 capsule 2   clobetasol cream (TEMOVATE) 8.18 % Apply 1 application topically 2 (two) times daily. 30 g 5   vitamin C (ASCORBIC ACID) 500 MG tablet Take 500 mg by mouth daily.     Zinc Sulfate (ZINC-220 PO) Take by mouth.     No current facility-administered medications for this visit.    Patient confirms/reports the following allergies:  No Known Allergies  No orders of the defined types were placed in this encounter.   AUTHORIZATION INFORMATION Primary Insurance: 1D#: Group #:  Secondary Insurance: 1D#: Group #:  SCHEDULE INFORMATION: Date: 08/18/21 Time: Location: [place]

## 2021-08-15 ENCOUNTER — Telehealth: Payer: Self-pay | Admitting: Gastroenterology

## 2021-08-15 NOTE — Telephone Encounter (Signed)
Patient going out of town for work and cannot make appt on 08/18/2021. Please call to reschedule.

## 2021-08-16 ENCOUNTER — Telehealth: Payer: Self-pay

## 2021-08-16 DIAGNOSIS — Z1211 Encounter for screening for malignant neoplasm of colon: Secondary | ICD-10-CM

## 2021-08-16 NOTE — Telephone Encounter (Signed)
Patients call has been returned.  He has requested to reschedule his 08/18/21 colonoscopy with Dr. Marius Ditch to a later date in September due to work travel.  His colonoscopy has been rescheduled to 11/13/21 with Dr. Marius Ditch.  Kieth Brightly in Endoscopy has been notified of date change.  New instructions will be sent to patient via mychart and mail.  Referral updated and sent to [facility]  Thanks, Douglas Avila, [place]

## 2021-11-02 ENCOUNTER — Encounter: Payer: Self-pay | Admitting: Internal Medicine

## 2021-11-02 DIAGNOSIS — A6000 Herpesviral infection of urogenital system, unspecified: Secondary | ICD-10-CM

## 2021-11-02 MED ORDER — ACYCLOVIR 200 MG PO CAPS: 200.0000 mg | ORAL_CAPSULE | Freq: Two times a day (BID) | ORAL | 2 refills | Status: DC

## 2021-11-10 ENCOUNTER — Encounter: Payer: Self-pay | Admitting: Gastroenterology

## 2021-11-13 ENCOUNTER — Ambulatory Visit: Payer: 59 | Admitting: Certified Registered Nurse Anesthetist

## 2021-11-13 ENCOUNTER — Encounter
Admission: RE | Disposition: A | Discharge status: Home / Self Care | Payer: Self-pay | Source: Ambulatory Visit | Attending: Gastroenterology

## 2021-11-13 ENCOUNTER — Ambulatory Visit
Admission: RE | Admit: 2021-11-13 | Discharge: 2021-11-13 | Disposition: A | Discharge status: Home / Self Care | Payer: 59 | Source: Ambulatory Visit | Attending: Gastroenterology | Admitting: Gastroenterology

## 2021-11-13 DIAGNOSIS — Z1211 Encounter for screening for malignant neoplasm of colon: Secondary | ICD-10-CM | POA: Diagnosis present

## 2021-11-13 DIAGNOSIS — K5730 Diverticulosis of large intestine without perforation or abscess without bleeding: Secondary | ICD-10-CM | POA: Diagnosis not present

## 2021-11-13 DIAGNOSIS — K644 Residual hemorrhoidal skin tags: Secondary | ICD-10-CM | POA: Insufficient documentation

## 2021-11-13 DIAGNOSIS — K621 Rectal polyp: Secondary | ICD-10-CM | POA: Diagnosis not present

## 2021-11-13 DIAGNOSIS — E785 Hyperlipidemia, unspecified: Secondary | ICD-10-CM | POA: Insufficient documentation

## 2021-11-13 HISTORY — PX: COLONOSCOPY WITH PROPOFOL: SHX5780

## 2021-11-13 SURGERY — COLONOSCOPY WITH PROPOFOL
Anesthesia: General

## 2021-11-13 MED ORDER — SODIUM CHLORIDE 0.9 % IV SOLN
INTRAVENOUS | Status: DC
  Administered 2021-11-13: 20 mL/h via INTRAVENOUS

## 2021-11-13 MED ORDER — PROPOFOL 10 MG/ML IV BOLUS
INTRAVENOUS | Status: DC | PRN
  Administered 2021-11-13: 80 mg via INTRAVENOUS
  Administered 2021-11-13: 50 mg via INTRAVENOUS
  Administered 2021-11-13: 20 mg via INTRAVENOUS
  Administered 2021-11-13: 30 mg via INTRAVENOUS
  Administered 2021-11-13: 20 mg via INTRAVENOUS

## 2021-11-13 MED ORDER — PROPOFOL 500 MG/50ML IV EMUL
INTRAVENOUS | Status: DC | PRN
  Administered 2021-11-13: 160 ug/kg/min via INTRAVENOUS

## 2021-11-13 MED ORDER — MIDAZOLAM HCL 2 MG/2ML IJ SOLN
INTRAMUSCULAR | Status: AC
  Filled 2021-11-13: qty 2

## 2021-11-13 MED ORDER — MIDAZOLAM HCL 5 MG/5ML IJ SOLN
INTRAMUSCULAR | Status: DC | PRN
  Administered 2021-11-13: 2 mg via INTRAVENOUS

## 2021-11-13 MED ORDER — PROPOFOL 10 MG/ML IV BOLUS
INTRAVENOUS | Status: AC
  Filled 2021-11-13: qty 20

## 2021-11-13 NOTE — Transfer of Care (Signed)
Immediate Anesthesia Transfer of Care Note  Patient: Douglas Avila  Procedure(s) Performed: COLONOSCOPY WITH PROPOFOL  Patient Location: PACU  Anesthesia Type:General  Level of Consciousness: drowsy  Airway & Oxygen Therapy: Patient Spontanous Breathing and Patient connected to nasal cannula oxygen  Post-op Assessment: Report given to RN and Post -op Vital signs reviewed and stable  Post vital signs: Reviewed and stable  Last Vitals:  Vitals Value Taken Time  BP 136/90 11/13/21 1027  Temp 35.7 C 11/13/21 1026  Pulse 62 11/13/21 1029  Resp 19 11/13/21 1029  SpO2 98 % 11/13/21 1029  Vitals shown include unvalidated device data.  Last Pain:  Vitals:   11/13/21 1026  TempSrc: Tympanic  PainSc: Asleep         Complications: No notable events documented.

## 2021-11-13 NOTE — Anesthesia Postprocedure Evaluation (Signed)
Anesthesia Post Note  Patient: Douglas Avila  Procedure(s) Performed: COLONOSCOPY WITH PROPOFOL  Anesthesia Type: General Anesthetic complications: no   No notable events documented.   Last Vitals:  Vitals:   11/13/21 0857 11/13/21 1026  BP: (!) 139/91 (!) 136/90  Pulse: 69 62  Resp: 20 (!) 21  Temp: (!) 35.8 C (!) 35.7 C  SpO2: 99% 98%    Last Pain:  Vitals:   11/13/21 1026  TempSrc: Tympanic  PainSc: Asleep                 VAN STAVEREN,Channah Godeaux

## 2021-11-13 NOTE — Op Note (Signed)
[facility] Gastroenterology Patient Name: Douglas Avila Procedure Date: 11/13/2021 9:47 AM MRN: [id number] Account #: [id number] Date of Birth: October 16, 1970 Admit Type: Outpatient Age: 51 Room: [facility] ENDO ROOM 4 Gender: Male Note Status: Finalized Instrument Name: Jasper Riling [id number] Procedure:             Colonoscopy Indications:           Screening for colorectal malignant neoplasm, This is                         the patient's first colonoscopy Providers:             Lin Landsman MD, MD Referring MD:          Jearld Fenton (Referring MD) Medicines:             General Anesthesia Complications:         No immediate complications. Estimated blood loss: None. Procedure:             Pre-Anesthesia Assessment:                        - Prior to the procedure, a History and Physical was                         performed, and patient medications and allergies were                         reviewed. The patient is competent. The risks and                         benefits of the procedure and the sedation options and                         risks were discussed with the patient. All questions                         were answered and informed consent was obtained.                         Patient identification and proposed procedure were                         verified by the physician, the nurse, the                         anesthesiologist, the anesthetist and the technician                         in the pre-procedure area in the procedure room in the                         endoscopy suite. Mental Status Examination: alert and                         oriented. Airway Examination: normal oropharyngeal                         airway and neck mobility. Respiratory Examination:  clear to auscultation. CV Examination: normal.                         Prophylactic Antibiotics: The patient does not require                         prophylactic  antibiotics. Prior Anticoagulants: The                         patient has taken no previous anticoagulant or                         antiplatelet agents. ASA Grade Assessment: II - A                         patient with mild systemic disease. After reviewing                         the risks and benefits, the patient was deemed in                         satisfactory condition to undergo the procedure. The                         anesthesia plan was to use general anesthesia.                         Immediately prior to administration of medications,                         the patient was re-assessed for adequacy to receive                         sedatives. The heart rate, respiratory rate, oxygen                         saturations, blood pressure, adequacy of pulmonary                         ventilation, and response to care were monitored                         throughout the procedure. The physical status of the                         patient was re-assessed after the procedure.                        After obtaining informed consent, the colonoscope was                         passed under direct vision. Throughout the procedure,                         the patient's blood pressure, pulse, and oxygen                         saturations were monitored continuously. The  Colonoscope was introduced through the anus and                         advanced to the the cecum, identified by appendiceal                         orifice and ileocecal valve. The colonoscopy was                         extremely difficult due to multiple diverticula in the                         colon and restricted mobility of the colon. Successful                         completion of the procedure was aided by changing the                         patient to a supine position and applying abdominal                         pressure. The patient tolerated the procedure well.                          The quality of the bowel preparation was evaluated                         using the BBPS [facility] Bowel Preparation Scale) with                         scores of: Right Colon = 3, Transverse Colon = 3 and                         Left Colon = 3 (entire mucosa seen well with no                         residual staining, small fragments of stool or opaque                         liquid). The total BBPS score equals 9. Findings:      The perianal and digital rectal examinations were normal. Pertinent       negatives include normal sphincter tone and no palpable rectal lesions.      A diminutive polyp was found in the distal rectum. The polyp was       sessile. The polyp was removed with a cold biopsy forceps. Resection and       retrieval were complete. Estimated blood loss: none.      Multiple diverticula were found in the recto-sigmoid colon and sigmoid       colon.      Non-bleeding external hemorrhoids were found during retroflexion. The       hemorrhoids were medium-sized. Impression:            - One diminutive polyp in the distal rectum, removed                         with a cold biopsy forceps.  Resected and retrieved.                        - Diverticulosis in the recto-sigmoid colon and in the                         sigmoid colon.                        - Non-bleeding external hemorrhoids. Recommendation:        - Discharge patient to home (with escort).                        - Resume previous diet today.                        - Continue present medications.                        - Await pathology results.                        - Repeat colonoscopy in 7-10 years for surveillance                         based on pathology results. Procedure Code(s):     --- Professional ---                        [phone], Colonoscopy, flexible; with biopsy, single or                         multiple Diagnosis Code(s):     --- Professional ---                        Z12.11, Encounter  for screening for malignant neoplasm                         of colon                        K62.1, Rectal polyp                        K64.4, Residual hemorrhoidal skin tags                        K57.30, Diverticulosis of large intestine without                         perforation or abscess without bleeding CPT copyright 2019 American Medical Association. All rights reserved. The codes documented in this report are preliminary and upon coder review may  be revised to meet current compliance requirements. Dr. Ulyess Mort Lin Landsman MD, MD 11/13/2021 10:24:59 AM This report has been signed electronically. Number of Addenda: 0 Note Initiated On: 11/13/2021 9:47 AM Scope Withdrawal Time: 0 hours 6 minutes 51 seconds  Total Procedure Duration: 0 hours 29 minutes 39 seconds  Estimated Blood Loss:  Estimated blood loss: none.      [facility]

## 2021-11-13 NOTE — Anesthesia Procedure Notes (Signed)
Date/Time: 11/13/2021 9:48 AM  Performed by: Demetrius Charity, CRNAPre-anesthesia Checklist: Patient identified, Emergency Drugs available, Suction available, Patient being monitored and Timeout performed Patient Re-evaluated:Patient Re-evaluated prior to induction Oxygen Delivery Method: Nasal cannula Induction Type: IV induction Placement Confirmation: CO2 detector and positive ETCO2

## 2021-11-13 NOTE — H&P (Signed)
Cephas Darby, MD [address]  [place]  [place],  [address]  Main: [phone]  Fax: [phone] Pager: [phone]  Primary Care Physician:  Jearld Fenton, NP Primary Gastroenterologist:  Dr. Cephas Darby  Pre-Procedure History & Physical: HPI:  Douglas Avila is a 51 y.o. male is here for an colonoscopy.   Past Medical History:  Diagnosis Date   Chicken pox    Depression    Hyperlipidemia     Past Surgical History:  Procedure Laterality Date   LUMBAR [place] SURGERY  2016    Prior to Admission medications   Medication Sig Start Date End Date Taking? Authorizing Provider  acyclovir (ZOVIRAX) 200 MG capsule Take 1 capsule (200 mg total) by mouth 2 (two) times daily. 11/02/21  Yes Baity, Coralie Keens, NP  clobetasol cream (TEMOVATE) 1.01 % Apply 1 application topically 2 (two) times daily. 02/28/21  Yes Baity, Coralie Keens, NP  lovastatin (MEVACOR) 10 MG tablet Take 1 tablet (10 mg total) by mouth at bedtime. 07/13/21  Yes Baity, Coralie Keens, NP  vitamin C (ASCORBIC ACID) 500 MG tablet Take 500 mg by mouth daily.   Yes [provider]  Zinc Sulfate (ZINC-220 PO) Take by mouth.   Yes [provider]    Allergies as of 07/13/2021   (No Known Allergies)    Family History  Problem Relation Age of Onset   CVA Mother    Diabetes Father    Healthy Brother    Colon cancer Neg Hx    Hearing loss Neg Hx    Prostate cancer Neg Hx     Social History   Socioeconomic History   Marital status: Single    Spouse name: Not on file   Number of children: Not on file   Years of education: Not on file   Highest education level: Not on file  Occupational History   Not on file  Tobacco Use   Smoking status: Never   Smokeless tobacco: Never  Vaping Use   Vaping Use: Never used  Substance and Sexual Activity   Alcohol use: Yes    Alcohol/week: 3.0 standard drinks of alcohol    Types: 3 Cans of beer per week    Comment: occasional   Drug use: No    Sexual activity: Yes  Other Topics Concern   Not on file  Social History Narrative   ** Merged History Encounter **       Social Determinants of Health   Financial Resource Strain: Not on file  Food Insecurity: Not on file  Transportation Needs: Not on file  Physical Activity: Not on file  Stress: Not on file  Social Connections: Not on file  Intimate Partner Violence: Not on file    Review of Systems: See HPI, otherwise negative ROS  Physical Exam: BP (!) 139/91   Pulse 69   Temp (!) 96.4 F (35.8 C) (Temporal)   Resp 20   Ht 6' (1.829 m)   Wt 83.9 kg   SpO2 99%   BMI 25.09 kg/m  General:   Alert,  pleasant and cooperative in NAD Head:  Normocephalic and atraumatic. Neck:  Supple; no masses or thyromegaly. Lungs:  Clear throughout to auscultation.    Heart:  Regular rate and rhythm. Abdomen:  Soft, nontender and nondistended. Normal bowel sounds, without guarding, and without rebound.   Neurologic:  Alert and  oriented x4;  grossly normal neurologically.  Impression/Plan: Douglas Avila is here for an  colonoscopy to be performed for colon cancer screening  Risks, benefits, limitations, and alternatives regarding  colonoscopy have been reviewed with the patient.  Questions have been answered.  All parties agreeable.   Sherri Sear, MD  11/13/2021, 9:00 AM

## 2021-11-13 NOTE — Anesthesia Preprocedure Evaluation (Addendum)
Anesthesia Evaluation  Patient identified by MRN, date of birth, ID band Patient awake    Reviewed: Allergy & Precautions, NPO status , Patient's Chart, lab work & pertinent test results  Airway Mallampati: II  TM Distance: >3 FB Neck ROM: full    Dental  (+) Teeth Intact   Pulmonary neg pulmonary ROS,    Pulmonary exam normal breath sounds clear to auscultation       Cardiovascular Exercise Tolerance: Good negative cardio ROS Normal cardiovascular exam Rhythm:Regular     Neuro/Psych negative neurological ROS  negative psych ROS   GI/Hepatic negative GI ROS, Neg liver ROS,   Endo/Other  negative endocrine ROS  Renal/GU negative Renal ROS  negative genitourinary   Musculoskeletal negative musculoskeletal ROS (+)   Abdominal Normal abdominal exam  (+)   Peds negative pediatric ROS (+)  Hematology negative hematology ROS (+)   Anesthesia Other Findings Past Medical History: No date: Chicken pox No date: Depression No date: Hyperlipidemia  Past Surgical History: 2016: LUMBAR DISC SURGERY  BMI    Body Mass Index: 25.09 kg/m      Reproductive/Obstetrics negative OB ROS                             Anesthesia Physical Anesthesia Plan  ASA: 2  Anesthesia Plan: General   Post-op Pain Management:    Induction: Intravenous  PONV Risk Score and Plan: Propofol infusion and TIVA  Airway Management Planned: Natural Airway  Additional Equipment:   Intra-op Plan:   Post-operative Plan:   Informed Consent: I have reviewed the patients History and Physical, chart, labs and discussed the procedure including the risks, benefits and alternatives for the proposed anesthesia with the patient or authorized representative who has indicated his/her understanding and acceptance.     Dental Advisory Given  Plan Discussed with: CRNA and Surgeon  Anesthesia Plan Comments:          Anesthesia Quick Evaluation

## 2021-11-14 ENCOUNTER — Encounter: Payer: Self-pay | Admitting: Gastroenterology

## 2021-11-14 LAB — SURGICAL PATHOLOGY

## 2022-02-15 ENCOUNTER — Encounter: Payer: Self-pay | Admitting: Internal Medicine

## 2022-02-20 ENCOUNTER — Ambulatory Visit (INDEPENDENT_AMBULATORY_CARE_PROVIDER_SITE_OTHER): Payer: 59 | Admitting: Internal Medicine

## 2022-02-20 ENCOUNTER — Encounter: Payer: Self-pay | Admitting: Internal Medicine

## 2022-02-20 VITALS — BP 136/82 | HR 82 | Ht 72.0 in | Wt 187.0 lb

## 2022-02-20 DIAGNOSIS — J069 Acute upper respiratory infection, unspecified: Secondary | ICD-10-CM | POA: Diagnosis not present

## 2022-02-20 LAB — POCT RAPID STREP A (OFFICE): Rapid Strep A Screen: NEGATIVE

## 2022-02-20 MED ORDER — PREDNISONE 10 MG PO TABS: ORAL_TABLET | ORAL | 0 refills | Status: DC

## 2022-02-20 MED ORDER — PROMETHAZINE-DM 6.25-15 MG/5ML PO SYRP: 5.0000 mL | ORAL_SOLUTION | Freq: Four times a day (QID) | ORAL | 0 refills | Status: DC | PRN

## 2022-02-20 NOTE — Addendum Note (Signed)
Addended by: Jearld Fenton on: 02/20/2022 02:09 PM   Modules accepted: Orders

## 2022-02-20 NOTE — Progress Notes (Addendum)
HPI  Pt presents to the clinic today with c/o headache, runny nose, nasal congestion, ear pain, sore throat, cough, shortness of breath. This started 1 week ago.  The headache is located in his forehead and behind his eyes.  He describes the pain as pressure.  He is not blowing much mucus out of his nose.  He is having some difficulty swallowing.  The cough is mostly nonproductive.  He denies nausea, vomiting or diarrhea.  He denies fever, chills or body aches.  He has tried Dayquil, Nyquil and Mucinex OTC with minimal relief of symptoms. He has not had sick contacts. He took a covid test at home which was negative.   Review of Systems      Past Medical History:  Diagnosis Date   Chicken pox    Depression    Hyperlipidemia     Family History  Problem Relation Age of Onset   CVA Mother    Diabetes Father    Healthy Brother    Colon cancer Neg Hx    Hearing loss Neg Hx    Prostate cancer Neg Hx     Social History   Socioeconomic History   Marital status: Single    Spouse name: Not on file   Number of children: Not on file   Years of education: Not on file   Highest education level: Not on file  Occupational History   Not on file  Tobacco Use   Smoking status: Never   Smokeless tobacco: Never  Vaping Use   Vaping Use: Never used  Substance and Sexual Activity   Alcohol use: Yes    Alcohol/week: 3.0 standard drinks of alcohol    Types: 3 Cans of beer per week    Comment: occasional   Drug use: No   Sexual activity: Yes  Other Topics Concern   Not on file  Social History Narrative   ** Merged History Encounter **       Social Determinants of Health   Financial Resource Strain: Not on file  Food Insecurity: Not on file  Transportation Needs: Not on file  Physical Activity: Not on file  Stress: Not on file  Social Connections: Not on file  Intimate Partner Violence: Not on file    No Known Allergies   Constitutional: Positive headache, fatigue. Denies fever  or abrupt weight changes.  HEENT:  Positive runny nose, nasal congestion, ear pain and sore throat. Denies eye redness, eye pain, pressure behind the eyes, facial pain, ringing in the ears, wax buildup,  or bloody nose. Respiratory: Positive cough. Denies difficulty breathing or shortness of breath.  Cardiovascular: Denies chest pain, chest tightness, palpitations or swelling in the hands or feet.   No other specific complaints in a complete review of systems (except as listed in HPI above).  Objective:  BP 136/82   Pulse 82   Ht 6' (1.829 m)   Wt 187 lb (84.8 kg)   SpO2 99%   BMI 25.36 kg/m   Wt Readings from Last 3 Encounters:  11/13/21 185 lb (83.9 kg)  07/12/21 196 lb (88.9 kg)  11/17/20 189 lb (85.7 kg)     General: Appears his stated age, well developed, well nourished in NAD. HEENT: Head: normal shape and size, no sinus tenderness noted; Eyes: sclera white, no icterus, conjunctiva pink; Ears: Tm's gray and intact, normal light reflex; Nose: mucosa pink and moist, septum midline; Throat/Mouth: + PND. Teeth present, mucosa erythematous and moist, no exudate noted, no lesions  or ulcerations noted.  Neck: No cervical lymphadenopathy.  Cardiovascular: Normal rate and rhythm. S1,S2 noted.  No murmur, rubs or gallops noted.  Pulmonary/Chest: Normal effort and positive vesicular breath sounds. No respiratory distress. No wheezes, rales or ronchi noted.       Assessment & Plan:   Viral URI with Cough:  Get some rest and drink plenty of water Rapid strep negative Do salt water gargles for the sore throat eRx for Pred taper x 6 days eRx for Promethazine DM cough syrup No indication for antibiotics at this time but would consider at the end of the week if not feeling any better  Schedule an appointment for annual exam   Webb Silversmith, NP

## 2022-02-20 NOTE — Patient Instructions (Signed)
Viral Respiratory Infection A respiratory infection is an illness that affects part of the respiratory system, such as the lungs, nose, or throat. A respiratory infection that is caused by a virus is called a viral respiratory infection. Common types of viral respiratory infections include: A cold. The flu (influenza). A respiratory syncytial virus (RSV) infection. What are the causes? This condition is caused by a virus. The virus may spread through contact with droplets or direct contact with infected people or their mucus or secretions. The virus may spread from person to person (is contagious). What are the signs or symptoms? Symptoms of this condition include: A stuffy or runny nose. A sore throat or cough. Shortness of breath or difficulty breathing. Yellow or green mucus (sputum). Other symptoms may include: A fever. Sweating or chills. Fatigue. Achy muscles. A headache. How is this diagnosed? This condition may be diagnosed based on: Your symptoms. A physical exam. Testing of secretions from the nose or throat. Chest X-ray. How is this treated? This condition may be treated with medicines, such as: Antiviral medicine. This may shorten the length of time a person has symptoms. Expectorants. These make it easier to cough up mucus. Decongestant nasal sprays. Acetaminophen or NSAIDs, such as ibuprofen, to relieve fever and pain. Antibiotic medicines are not prescribed for viral infections.This is because antibiotics are designed to kill bacteria. They do not kill viruses. Follow these instructions at home: Managing pain and congestion Take over-the-counter and prescription medicines only as told by your health care provider. If you have a sore throat, gargle with a mixture of salt and water 3-4 times a day or as needed. To make salt water, completely dissolve -1 tsp (3-6 g) of salt in 1 cup (237 mL) of warm water. Use nose drops made from salt water to ease congestion and  soften raw skin around your nose. Take 2 tsp (10 mL) of honey at bedtime to lessen coughing at night. Do not give honey to children who are younger than 1 year. Drink enough fluid to keep your urine pale yellow. This helps prevent dehydration and helps loosen up mucus. General instructions  Rest as much as possible. Do not drink alcohol. Do not use any products that contain nicotine or tobacco. These products include cigarettes, chewing tobacco, and vaping devices, such as e-cigarettes. If you need help quitting, ask your health care provider. Keep all follow-up visits. This is important. How is this prevented?     Get an annual flu shot. You may get the flu shot in late summer, fall, or winter. Ask your health care provider when you should get your flu shot. Avoid spreading your infection to other people. If you are sick: Wash your hands with soap and water often, especially after you cough or sneeze. Wash for at least 20 seconds. If soap and water are not available, use alcohol-based hand sanitizer. Cover your mouth when you cough. Cover your nose and mouth when you sneeze. Do not share cups or eating utensils. Clean commonly used objects often. Clean commonly touched surfaces. Stay home from work or school as told by your health care provider. Avoid contact with people who are sick during cold and flu season. This is generally fall and winter. Contact a health care provider if: Your symptoms last for 10 days or longer. Your symptoms get worse over time. You have severe sinus pain in your face or forehead. The glands in your jaw or neck become very swollen. You have shortness of breath. Get   help right away if you: Feel pain or pressure in your chest. Have trouble breathing. Faint or feel like you will faint. Have severe and persistent vomiting. Feel confused or disoriented. These symptoms may represent a serious problem that is an emergency. Do not wait to see if the symptoms will  go away. Get medical help right away. Call your local emergency services (911 in the [place] Do not drive yourself to the hospital. Summary A respiratory infection is an illness that affects part of the respiratory system, such as the lungs, nose, or throat. A respiratory infection that is caused by a virus is called a viral respiratory infection. Common types of viral respiratory infections include a cold, influenza, and respiratory syncytial virus (RSV) infection. Symptoms of this condition include a stuffy or runny nose, cough, fatigue, achy muscles, sore throat, and fevers or chills. Antibiotic medicines are not prescribed for viral infections. This is because antibiotics are designed to kill bacteria. They are not effective against viruses. This information is not intended to replace advice given to you by your health care provider. Make sure you discuss any questions you have with your health care provider. Document Revised: 05/19/2020 Document Reviewed: 05/19/2020 Elsevier Patient Education  2023 [employer]  

## 2022-02-23 NOTE — Telephone Encounter (Signed)
He will need an appt for this. Unfortunately I have no available appts today.

## 2022-04-05 ENCOUNTER — Ambulatory Visit (INDEPENDENT_AMBULATORY_CARE_PROVIDER_SITE_OTHER): Payer: 59 | Admitting: Internal Medicine

## 2022-04-05 ENCOUNTER — Ambulatory Visit
Admission: RE | Admit: 2022-04-05 | Discharge: 2022-04-05 | Disposition: A | Discharge status: Home / Self Care | Payer: 59 | Source: Ambulatory Visit | Attending: Internal Medicine | Admitting: Internal Medicine

## 2022-04-05 ENCOUNTER — Ambulatory Visit
Admission: RE | Admit: 2022-04-05 | Discharge: 2022-04-05 | Disposition: A | Discharge status: Home / Self Care | Payer: 59 | Attending: Internal Medicine | Admitting: Internal Medicine

## 2022-04-05 ENCOUNTER — Encounter: Payer: Self-pay | Admitting: Internal Medicine

## 2022-04-05 VITALS — BP 120/84 | HR 79 | Temp 97.1°F | Ht 72.0 in | Wt 191.0 lb

## 2022-04-05 DIAGNOSIS — Z0001 Encounter for general adult medical examination with abnormal findings: Secondary | ICD-10-CM | POA: Diagnosis not present

## 2022-04-05 DIAGNOSIS — E663 Overweight: Secondary | ICD-10-CM

## 2022-04-05 DIAGNOSIS — R7309 Other abnormal glucose: Secondary | ICD-10-CM

## 2022-04-05 DIAGNOSIS — R739 Hyperglycemia, unspecified: Secondary | ICD-10-CM

## 2022-04-05 DIAGNOSIS — M79641 Pain in right hand: Secondary | ICD-10-CM

## 2022-04-05 DIAGNOSIS — M79642 Pain in left hand: Secondary | ICD-10-CM

## 2022-04-05 DIAGNOSIS — M7989 Other specified soft tissue disorders: Secondary | ICD-10-CM | POA: Diagnosis present

## 2022-04-05 DIAGNOSIS — E782 Mixed hyperlipidemia: Secondary | ICD-10-CM | POA: Diagnosis not present

## 2022-04-05 DIAGNOSIS — Z6825 Body mass index (BMI) 25.0-25.9, adult: Secondary | ICD-10-CM

## 2022-04-05 MED ORDER — ACYCLOVIR 800 MG PO TABS: 800.0000 mg | ORAL_TABLET | Freq: Two times a day (BID) | ORAL | 3 refills | Status: DC

## 2022-04-05 NOTE — Patient Instructions (Signed)
Health Maintenance, Male Adopting a healthy lifestyle and getting preventive care are important in promoting health and wellness. Ask your health care provider about: The right schedule for you to have regular tests and exams. Things you can do on your own to prevent diseases and keep yourself healthy. What should I know about diet, weight, and exercise? Eat a healthy diet  Eat a diet that includes plenty of vegetables, fruits, low-fat dairy products, and lean protein. Do not eat a lot of foods that are high in solid fats, added sugars, or sodium. Maintain a healthy weight Body mass index (BMI) is a measurement that can be used to identify possible weight problems. It estimates body fat based on height and weight. Your health care provider can help determine your BMI and help you achieve or maintain a healthy weight. Get regular exercise Get regular exercise. This is one of the most important things you can do for your health. Most adults should: Exercise for at least 150 minutes each week. The exercise should increase your heart rate and make you sweat (moderate-intensity exercise). Do strengthening exercises at least twice a week. This is in addition to the moderate-intensity exercise. Spend less time sitting. Even light physical activity can be beneficial. Watch cholesterol and blood lipids Have your blood tested for lipids and cholesterol at 51 years of age, then have this test every 5 years. You may need to have your cholesterol levels checked more often if: Your lipid or cholesterol levels are high. You are older than 51 years of age. You are at high risk for heart disease. What should I know about cancer screening? Many types of cancers can be detected early and may often be prevented. Depending on your health history and family history, you may need to have cancer screening at various ages. This may include screening for: Colorectal cancer. Prostate cancer. Skin cancer. Lung  cancer. What should I know about heart disease, diabetes, and high blood pressure? Blood pressure and heart disease High blood pressure causes heart disease and increases the risk of stroke. This is more likely to develop in people who have high blood pressure readings or are overweight. Talk with your health care provider about your target blood pressure readings. Have your blood pressure checked: Every 3-5 years if you are 18-39 years of age. Every year if you are 40 years old or older. If you are between the ages of 65 and 75 and are a current or former smoker, ask your health care provider if you should have a one-time screening for abdominal aortic aneurysm (AAA). Diabetes Have regular diabetes screenings. This checks your fasting blood sugar level. Have the screening done: Once every three years after age 45 if you are at a normal weight and have a low risk for diabetes. More often and at a younger age if you are overweight or have a high risk for diabetes. What should I know about preventing infection? Hepatitis B If you have a higher risk for hepatitis B, you should be screened for this virus. Talk with your health care provider to find out if you are at risk for hepatitis B infection. Hepatitis C Blood testing is recommended for: Everyone born from 1945 through 1965. Anyone with known risk factors for hepatitis C. Sexually transmitted infections (STIs) You should be screened each year for STIs, including gonorrhea and chlamydia, if: You are sexually active and are younger than 51 years of age. You are older than 51 years of age and your   health care provider tells you that you are at risk for this type of infection. Your sexual activity has changed since you were last screened, and you are at increased risk for chlamydia or gonorrhea. Ask your health care provider if you are at risk. Ask your health care provider about whether you are at high risk for HIV. Your health care provider  may recommend a prescription medicine to help prevent HIV infection. If you choose to take medicine to prevent HIV, you should first get tested for HIV. You should then be tested every 3 months for as long as you are taking the medicine. Follow these instructions at home: Alcohol use Do not drink alcohol if your health care provider tells you not to drink. If you drink alcohol: Limit how much you have to 0-2 drinks a day. Know how much alcohol is in your drink. In the [place] one drink equals one 12 oz bottle of beer (355 mL), one 5 oz glass of wine (148 mL), or one 1 oz glass of hard liquor (44 mL). Lifestyle Do not use any products that contain nicotine or tobacco. These products include cigarettes, chewing tobacco, and vaping devices, such as e-cigarettes. If you need help quitting, ask your health care provider. Do not use street drugs. Do not share needles. Ask your health care provider for help if you need support or information about quitting drugs. General instructions Schedule regular health, dental, and eye exams. Stay current with your vaccines. Tell your health care provider if: You often feel depressed. You have ever been abused or do not feel safe at home. Summary Adopting a healthy lifestyle and getting preventive care are important in promoting health and wellness. Follow your health care provider's instructions about healthy diet, exercising, and getting tested or screened for diseases. Follow your health care provider's instructions on monitoring your cholesterol and blood pressure. This information is not intended to replace advice given to you by your health care provider. Make sure you discuss any questions you have with your health care provider. Document Revised: 07/04/2020 Document Reviewed: 07/04/2020 Elsevier Patient Education  2023 [employer]  

## 2022-04-05 NOTE — Progress Notes (Signed)
Subjective:    Patient ID: Douglas Avila, male    DOB: 1970-10-10, 52 y.o.   MRN: [id number]  HPI  Patient presents to clinic today for his annual exam.  He also reports joint pain and swelling in hands.  He noticed this years ago but this seems to be getting worse in the last few months. He has no family history of RA.  Flu: 12/2021 Tetanus: 04/2013 COVID: Moderna x 2 Shingrix: 12/2021 PSA screening: 06/2021 Colon screening: 10/2021 Vision screening: as needed Dentist: annually  Diet: He does eat meat. He consumes fruits and veggies. He does eat some fried foods. He drinks mostly water, coffee. Exercise: body weight exercises.  Review of Systems     Past Medical History:  Diagnosis Date   Chicken pox    Depression    Hyperlipidemia     Current Outpatient Medications  Medication Sig Dispense Refill   acyclovir (ZOVIRAX) 200 MG capsule Take 1 capsule (200 mg total) by mouth 2 (two) times daily. 30 capsule 2   clobetasol cream (TEMOVATE) 1.51 % Apply 1 application topically 2 (two) times daily. 30 g 5   lovastatin (MEVACOR) 10 MG tablet Take 1 tablet (10 mg total) by mouth at bedtime. 90 tablet 0   predniSONE (DELTASONE) 10 MG tablet Take 6 tabs on day 1, 5 tabs on day 2, 4 tabs on day 3, 3 tabs on day 4, 2 tabs on day 5, 1 tab on day 6 21 tablet 0   promethazine-dextromethorphan (PROMETHAZINE-DM) 6.25-15 MG/5ML syrup Take 5 mLs by mouth 4 (four) times daily as needed. 118 mL 0   vitamin C (ASCORBIC ACID) 500 MG tablet Take 500 mg by mouth daily.     Zinc Sulfate (ZINC-220 PO) Take by mouth.     No current facility-administered medications for this visit.    No Known Allergies  Family History  Problem Relation Age of Onset   CVA Mother    Diabetes Father    Healthy Brother    Colon cancer Neg Hx    Hearing loss Neg Hx    Prostate cancer Neg Hx     Social History   Socioeconomic History   Marital status: Single    Spouse name: Not on file   Number of  children: Not on file   Years of education: Not on file   Highest education level: Not on file  Occupational History   Not on file  Tobacco Use   Smoking status: Never   Smokeless tobacco: Never  Vaping Use   Vaping Use: Never used  Substance and Sexual Activity   Alcohol use: Yes    Alcohol/week: 3.0 standard drinks of alcohol    Types: 3 Cans of beer per week    Comment: occasional   Drug use: No   Sexual activity: Yes  Other Topics Concern   Not on file  Social History Narrative   ** Merged History Encounter **       Social Determinants of Health   Financial Resource Strain: Not on file  Food Insecurity: Not on file  Transportation Needs: Not on file  Physical Activity: Not on file  Stress: Not on file  Social Connections: Not on file  Intimate Partner Violence: Not on file     Constitutional: Denies fever, malaise, fatigue, headache or abrupt weight changes.  HEENT: Denies eye pain, eye redness, ear pain, ringing in the ears, wax buildup, runny nose, nasal congestion, bloody nose, or sore throat. Respiratory:  Denies difficulty breathing, shortness of breath, cough or sputum production.   Cardiovascular: Denies chest pain, chest tightness, palpitations or swelling in the hands or feet.  Gastrointestinal: Denies abdominal pain, bloating, constipation, diarrhea or blood in the stool.  GU: Patient reports erectile dysfunction.  Denies urgency, frequency, pain with urination, burning sensation, blood in urine, odor or discharge. Musculoskeletal: Patient reports joint pain and swelling in hands.  Denies decrease in range of motion, difficulty with gait, muscle pain.  Skin: Denies redness, rashes, lesions or ulcercations.  Neurological: Denies dizziness, difficulty with memory, difficulty with speech or problems with balance and coordination.  Psych: Denies anxiety, depression, SI/HI.  No other specific complaints in a complete review of systems (except as listed in HPI  above).  Objective:   Physical Exam    BP 120/84 (BP Location: Left Arm, Patient Position: Sitting, Cuff Size: Normal)   Pulse 79   Temp (!) 97.1 F (36.2 C) (Temporal)   Ht 6' (1.829 m)   Wt 191 lb (86.6 kg)   SpO2 99%   BMI 25.90 kg/m   Wt Readings from Last 3 Encounters:  02/20/22 187 lb (84.8 kg)  11/13/21 185 lb (83.9 kg)  07/12/21 196 lb (88.9 kg)    General: Appears their stated age, overweight, in NAD. Skin: Warm, dry and intact. No rashes, lesions or ulcerations noted. HEENT: Head: normal shape and size; Eyes: sclera white, no icterus, conjunctiva pink, PERRLA and EOMs intact;  Neck:  Neck supple, trachea midline. No masses, lumps or thyromegaly present.  Cardiovascular: Normal rate and rhythm. S1,S2 noted.  No murmur, rubs or gallops noted. No JVD or BLE edema.  Pulmonary/Chest: Normal effort and positive vesicular breath sounds. No respiratory distress. No wheezes, rales or ronchi noted.  Abdomen: Normal bowel sounds. Musculoskeletal: Bouchard's nodes noted of bilateral thumbs.  Strength 5/5 BUE/BLE.  No difficulty with gait.  Neurological: Alert and oriented. Cranial nerves II-XII grossly intact. Coordination normal.  Psychiatric: Mood and affect normal. Behavior is normal. Judgment and thought content normal.     BMET    Component Value Date/Time   NA 142 07/12/2021 1033   K 4.6 07/12/2021 1033   CL 106 07/12/2021 1033   CO2 26 07/12/2021 1033   GLUCOSE 107 (H) 07/12/2021 1033   BUN 12 07/12/2021 1033   CREATININE 0.90 07/12/2021 1033   CALCIUM 9.3 07/12/2021 1033    Lipid Panel     Component Value Date/Time   CHOL 203 (H) 07/12/2021 1033   TRIG 138 07/12/2021 1033   HDL 39 (L) 07/12/2021 1033   CHOLHDL 5.2 (H) 07/12/2021 1033   VLDL 24.8 05/05/2019 0953   LDLCALC 137 (H) 07/12/2021 1033    CBC    Component Value Date/Time   WBC 6.0 07/12/2021 1033   RBC 5.37 07/12/2021 1033   HGB 16.2 07/12/2021 1033   HCT 49.0 07/12/2021 1033   PLT 224  07/12/2021 1033   MCV 91.2 07/12/2021 1033   MCH 30.2 07/12/2021 1033   MCHC 33.1 07/12/2021 1033   RDW 13.5 07/12/2021 1033    Hgb A1C Lab Results  Component Value Date   HGBA1C 5.6 07/12/2021          Assessment & Plan:   Preventative Health Maintenance:  Flu shot UTD Tetanus UTD Encouraged him to get his COVID booster Encouraged him to get his second Shingrix vaccine Colon screening UTD Encouraged him to consume a balanced diet and exercise regimen Advised him to see an eye doctor and  dentist annually We will check CBC, c-Met, lipid, A1c today  Joint Pain and Swelling in Hands:  Will check ANA, ESR, CRP and RF today X-ray bilateral hands today per his request   RTC in 6 months, follow-up chronic conditions Webb Silversmith, NP

## 2022-04-05 NOTE — Assessment & Plan Note (Signed)
Encourage diet and exercise for weight loss 

## 2022-04-06 ENCOUNTER — Encounter: Payer: Self-pay | Admitting: Internal Medicine

## 2022-04-06 LAB — HEMOGLOBIN A1C
Hgb A1c MFr Bld: 6 % of total Hgb — ABNORMAL HIGH (ref <5.7)
Mean Plasma Glucose: 126 mg/dL
eAG (mmol/L): 7 mmol/L

## 2022-04-06 LAB — LIPID PANEL
Cholesterol: 264 mg/dL — ABNORMAL HIGH (ref <200)
HDL: 44 mg/dL (ref >=40)
LDL Cholesterol (Calc): 186 mg/dL (calc) — ABNORMAL HIGH
Non-HDL Cholesterol (Calc): 220 mg/dL (calc) — ABNORMAL HIGH (ref <130)
Total CHOL/HDL Ratio: 6 (calc) — ABNORMAL HIGH (ref <5.0)
Triglycerides: 172 mg/dL — ABNORMAL HIGH (ref <150)

## 2022-04-06 LAB — CBC
HCT: 48.9 % (ref 38.5–50.0)
Hemoglobin: 16.7 g/dL (ref 13.2–17.1)
MCH: 31.3 pg (ref 27.0–33.0)
MCHC: 34.2 g/dL (ref 32.0–36.0)
MCV: 91.6 fL (ref 80.0–100.0)
MPV: 10 fL (ref 7.5–12.5)
Platelets: 263 10*3/uL (ref 140–400)
RBC: 5.34 10*6/uL (ref 4.20–5.80)
RDW: 12.6 % (ref 11.0–15.0)
WBC: 5.7 10*3/uL (ref 3.8–10.8)

## 2022-04-06 LAB — COMPLETE METABOLIC PANEL WITH GFR
AG Ratio: 1.8 (calc) (ref 1.0–2.5)
ALT: 65 U/L — ABNORMAL HIGH (ref 9–46)
AST: 37 U/L — ABNORMAL HIGH (ref 10–35)
Albumin: 4.9 g/dL (ref 3.6–5.1)
Alkaline phosphatase (APISO): 52 U/L (ref 35–144)
BUN: 14 mg/dL (ref 7–25)
CO2: 27 mmol/L (ref 20–32)
Calcium: 9.6 mg/dL (ref 8.6–10.3)
Chloride: 102 mmol/L (ref 98–110)
Creat: 0.94 mg/dL (ref 0.70–1.30)
Globulin: 2.7 g/dL (calc) (ref 1.9–3.7)
Glucose, Bld: 104 mg/dL — ABNORMAL HIGH (ref 65–99)
Potassium: 4.2 mmol/L (ref 3.5–5.3)
Sodium: 140 mmol/L (ref 135–146)
Total Bilirubin: 0.7 mg/dL (ref 0.2–1.2)
Total Protein: 7.6 g/dL (ref 6.1–8.1)
eGFR: 99 mL/min/{1.73_m2} (ref >=60)

## 2022-04-06 LAB — SEDIMENTATION RATE: Sed Rate: 6 mm/h (ref 0–15)

## 2022-04-06 LAB — C-REACTIVE PROTEIN: CRP: 0.8 mg/L (ref <8.0)

## 2022-04-06 LAB — RHEUMATOID FACTOR: Rheumatoid fact SerPl-aCnc: <14 IU/mL (ref <14)

## 2022-04-06 LAB — ANA: Anti Nuclear Antibody (ANA): NEGATIVE

## 2022-04-06 MED ORDER — LOVASTATIN 10 MG PO TABS: 10.0000 mg | ORAL_TABLET | Freq: Every day | ORAL | 1 refills | Status: DC

## 2022-04-20 IMAGING — DX DG SHOULDER 2+V*L*
3 series · 3 of 3 positions shown · non-contrast
Comparison: None.

CLINICAL DATA: Left shoulder pain after fall.

EXAM:
LEFT SHOULDER - 2+ VIEW

[shoulder neutral ap]
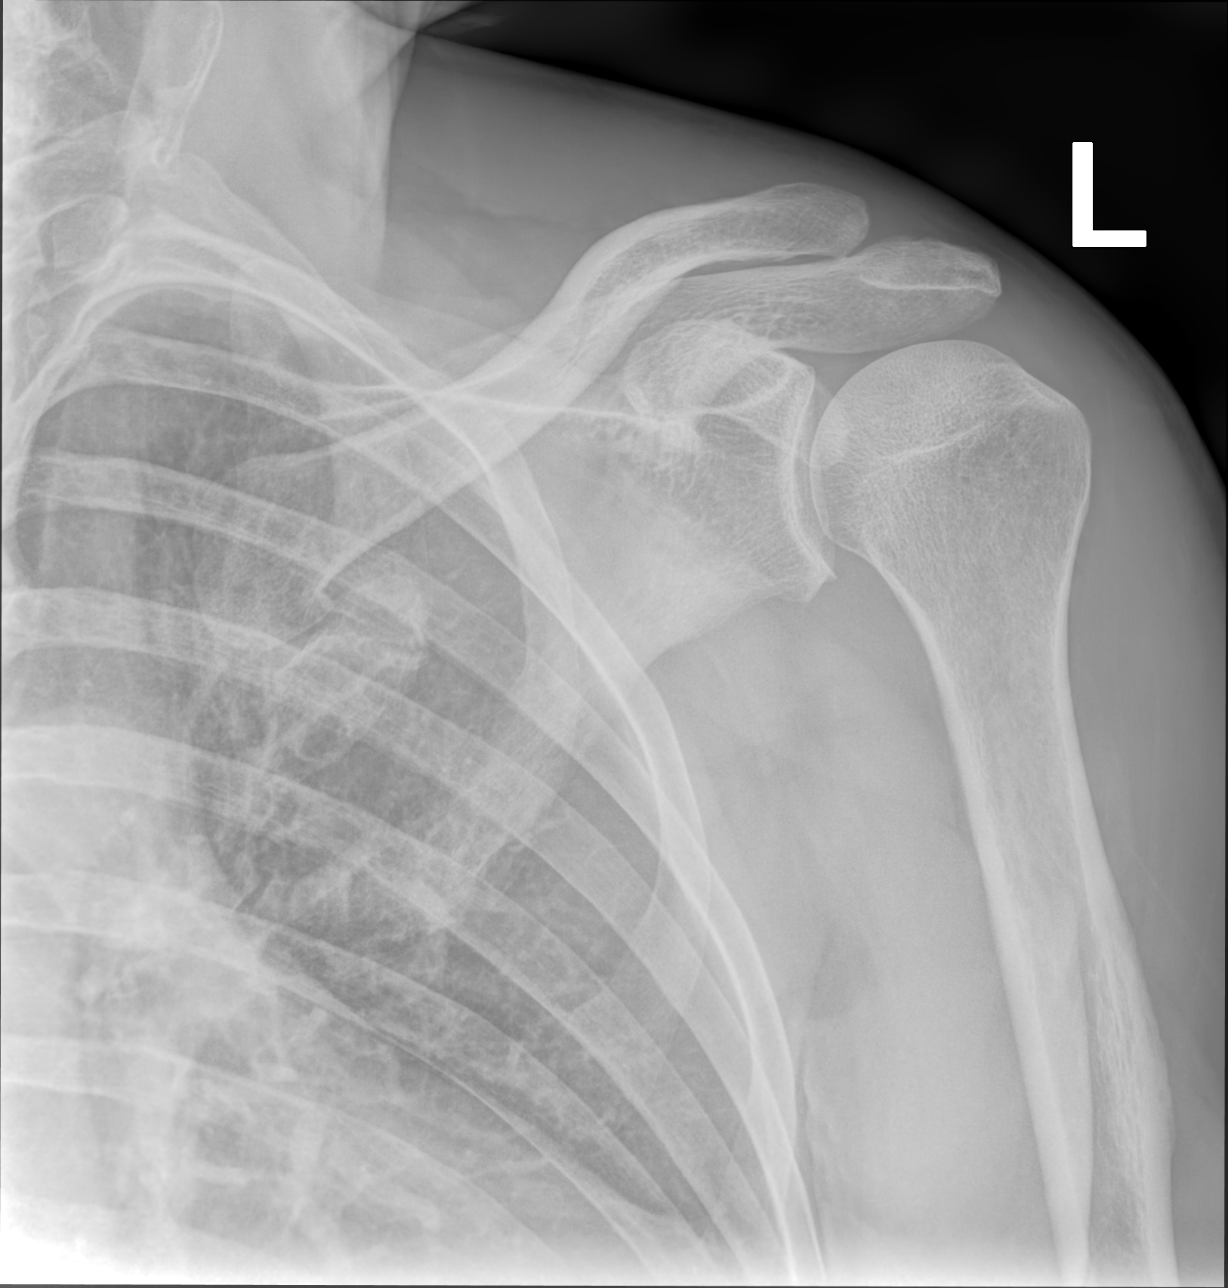

[shoulder transscapular y view (neer)]
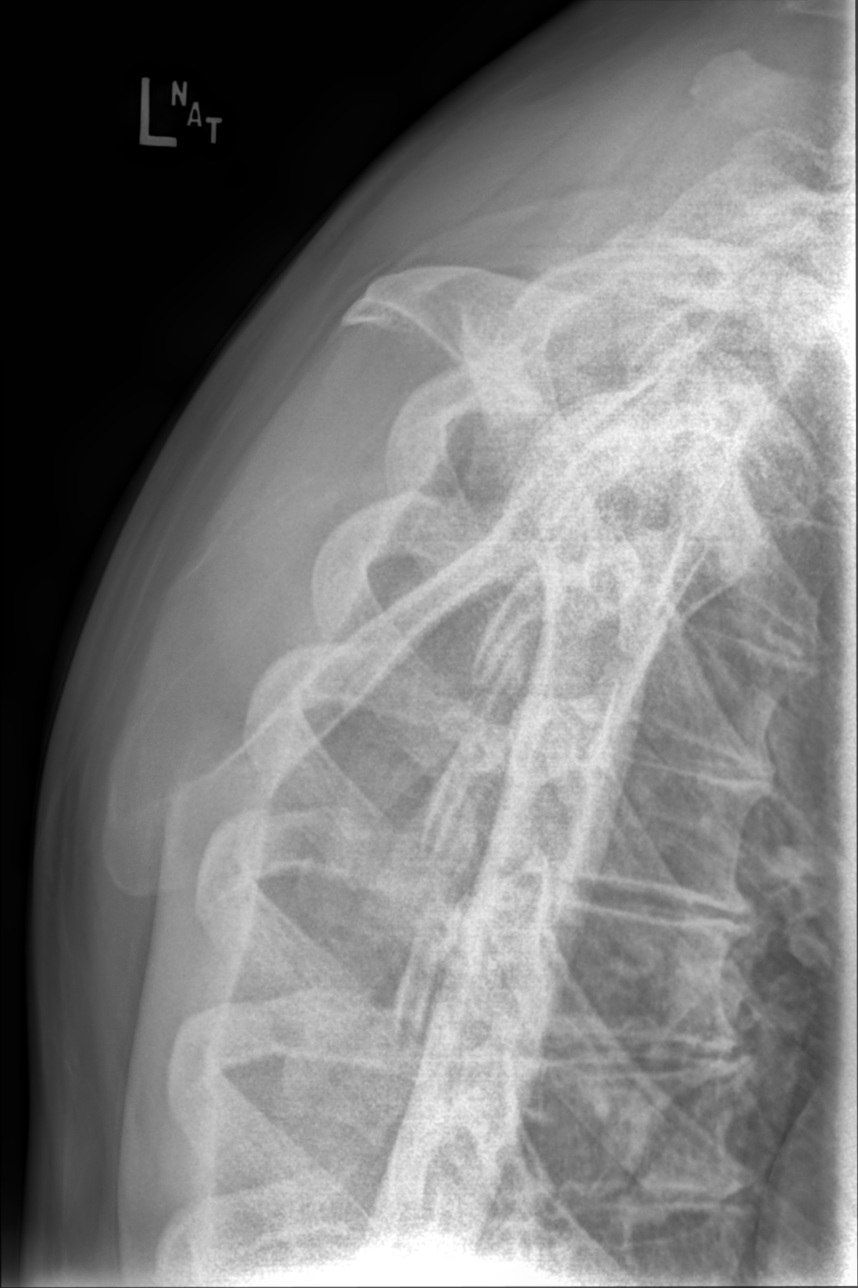

[shoulder axial 5° seated]
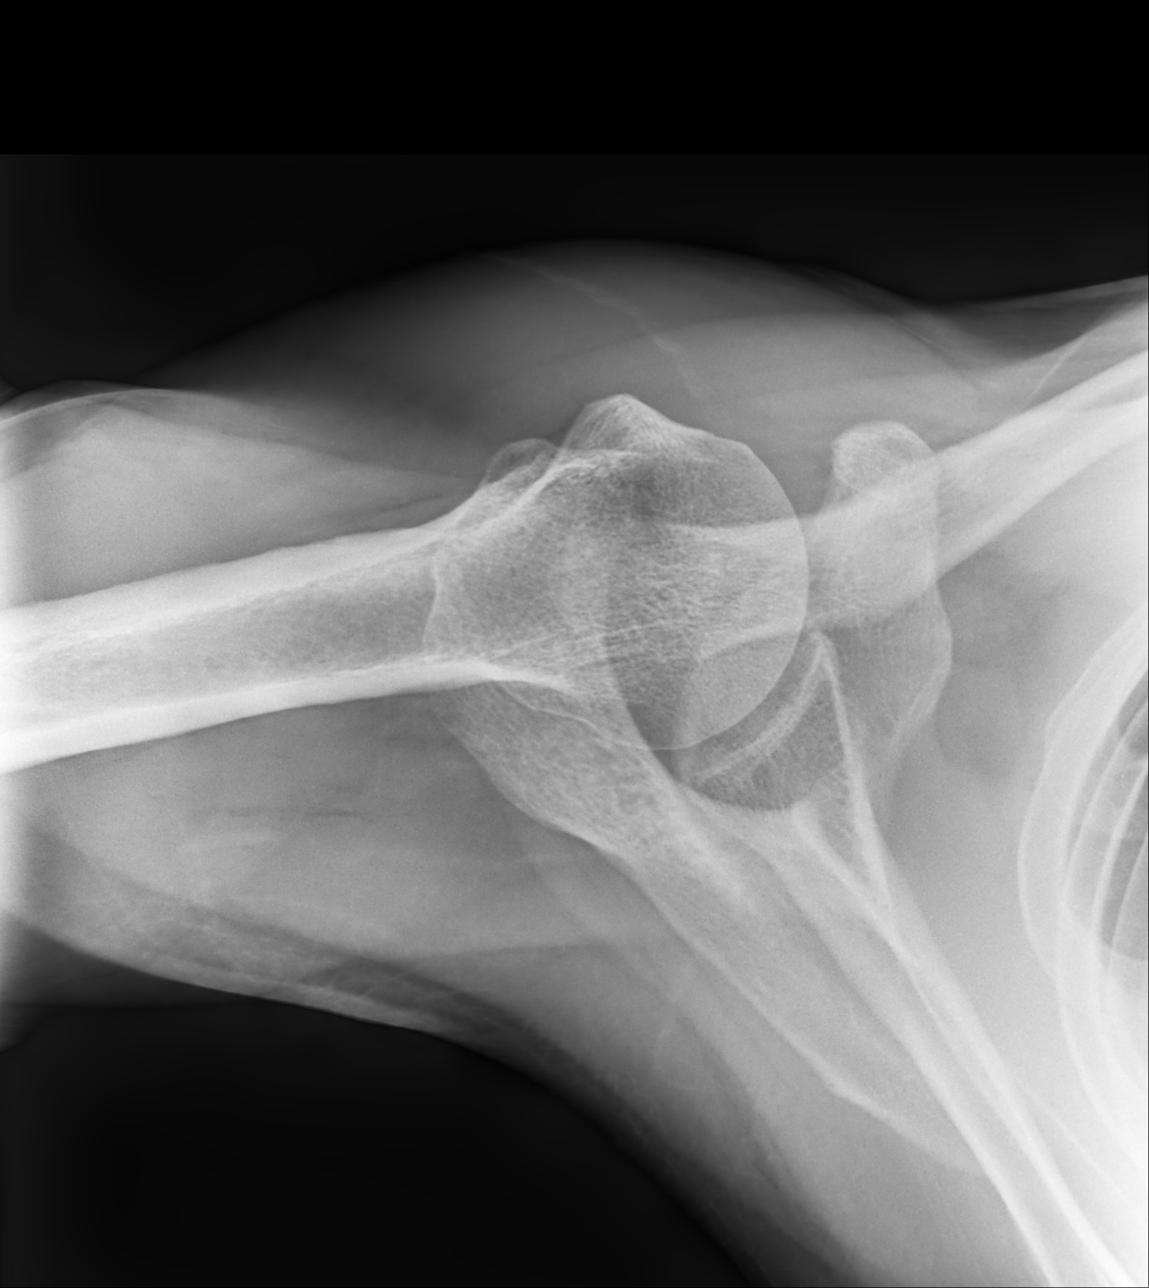

[3 of 3 positions shown; findings below may reference images not displayed]

FINDINGS: There is no evidence of fracture or dislocation. There is no
evidence of arthropathy or other focal bone abnormality. Soft
tissues are unremarkable.
IMPRESSION: Negative.

## 2022-04-23 ENCOUNTER — Ambulatory Visit (INDEPENDENT_AMBULATORY_CARE_PROVIDER_SITE_OTHER): Payer: 59 | Admitting: Internal Medicine

## 2022-04-23 ENCOUNTER — Encounter: Payer: Self-pay | Admitting: Internal Medicine

## 2022-04-23 VITALS — BP 134/82 | HR 112 | Temp 96.9°F | Wt 194.0 lb

## 2022-04-23 DIAGNOSIS — R509 Fever, unspecified: Secondary | ICD-10-CM | POA: Diagnosis not present

## 2022-04-23 DIAGNOSIS — J029 Acute pharyngitis, unspecified: Secondary | ICD-10-CM

## 2022-04-23 DIAGNOSIS — R519 Headache, unspecified: Secondary | ICD-10-CM | POA: Diagnosis not present

## 2022-04-23 DIAGNOSIS — R52 Pain, unspecified: Secondary | ICD-10-CM

## 2022-04-23 LAB — POCT INFLUENZA A/B
Influenza A, POC: NEGATIVE
Influenza B, POC: NEGATIVE

## 2022-04-23 LAB — POCT RAPID STREP A (OFFICE): Rapid Strep A Screen: NEGATIVE

## 2022-04-23 NOTE — Patient Instructions (Signed)

## 2022-04-23 NOTE — Progress Notes (Signed)
Subjective:    Patient ID: Douglas Avila, male    DOB: [birth year], 52 y.o.   MRN: [id number]  HPI  Patient presents to clinic today with complaint of  headache, sore throat, fever and bodyaches.  This started this morning. He is not having difficulty swallowing. He denies runny nose, nasal congestion, ear pain, cough, shortness of breath, nausea, vomiting or diarrhea. He reports his fever had got has high as 101. He has tried Ibuprofen, Alka Seltzer cold and flu with some relief of symptoms. He has not had sick contacts that he is aware.  He had a home negative COVID test.  Review of Systems     Past Medical History:  Diagnosis Date   Chicken pox    Depression    Hyperlipidemia     Current Outpatient Medications  Medication Sig Dispense Refill   acyclovir (ZOVIRAX) 800 MG tablet Take 1 tablet (800 mg total) by mouth 2 (two) times daily. 30 tablet 3   clobetasol cream (TEMOVATE) [id number] % Apply 1 application topically 2 (two) times daily. 30 g 5   lovastatin (MEVACOR) 10 MG tablet Take 1 tablet (10 mg total) by mouth at bedtime. 90 tablet 1   vitamin C (ASCORBIC ACID) 500 MG tablet Take 500 mg by mouth daily.     Zinc Sulfate (ZINC-220 PO) Take by mouth.     No current facility-administered medications for this visit.    No Known Allergies  Family History  Problem Relation Age of Onset   CVA Mother    Diabetes Father    Healthy Brother    Colon cancer Neg Hx    Hearing loss Neg Hx    Prostate cancer Neg Hx     Social History   Socioeconomic History   Marital status: Single    Spouse name: Not on file   Number of children: Not on file   Years of education: Not on file   Highest education level: Not on file  Occupational History   Not on file  Tobacco Use   Smoking status: Never   Smokeless tobacco: Never  Vaping Use   Vaping Use: Never used  Substance and Sexual Activity   Alcohol use: Yes    Alcohol/week: 3.0 standard drinks of alcohol    Types: 3 Cans of  beer per week    Comment: occasional   Drug use: No   Sexual activity: Yes  Other Topics Concern   Not on file  Social History Narrative   ** Merged History Encounter **       Social Determinants of Health   Financial Resource Strain: Not on file  Food Insecurity: Not on file  Transportation Needs: Not on file  Physical Activity: Not on file  Stress: Not on file  Social Connections: Not on file  Intimate Partner Violence: Not on file     Constitutional: Patient reports feverand headache.  Denies malaise, fatigue,  or abrupt weight changes.  HEENT: Patient reports sore throat.  Denies eye pain, eye redness, ear pain, ringing in the ears, wax buildup, runny nose, nasal congestion, bloody nose. Respiratory: Denies difficulty breathing, shortness of breath, cough or sputum production.   Cardiovascular: Denies chest pain, chest tightness, palpitations or swelling in the hands or feet.  Gastrointestinal: Denies abdominal pain, bloating, constipation, diarrhea or blood in the stool.   No other specific complaints in a complete review of systems (except as listed in HPI above).  Objective:   Physical Exam  BP  134/82 (BP Location: Left Arm, Patient Position: Sitting, Cuff Size: Normal)   Pulse (!) 112   Temp (!) 96.9 F (36.1 C) (Temporal)   Wt 194 lb (88 kg)   SpO2 98%   BMI 26.31 kg/m   Wt Readings from Last 3 Encounters:  04/05/22 191 lb (86.6 kg)  02/20/22 187 lb (84.8 kg)  11/13/21 185 lb (83.9 kg)    General: Appears his stated age, well developed, well nourished in NAD. Skin: Warm, dry and intact. No rashes noted. HEENT: Head: normal shape and size; Eyes: sclera white, no icterus, conjunctiva pink, PERRLA and EOMs intact; Throat/Mouth: Teeth present, mucosa erythematous and moist, no exudate, lesions or ulcerations noted.  Neck:  No adenopathy noted. Cardiovascular: Tachycardic with normal rhythm. S1,S2 noted.  No murmur, rubs or gallops noted.  Pulmonary/Chest:  Normal effort and positive vesicular breath sounds. No respiratory distress. No wheezes, rales or ronchi noted.  Neurological: Alert and oriented.    BMET    Component Value Date/Time   NA 140 04/05/2022 0905   K 4.2 04/05/2022 0905   CL 102 04/05/2022 0905   CO2 27 04/05/2022 0905   GLUCOSE 104 (H) 04/05/2022 0905   BUN 14 04/05/2022 0905   CREATININE 0.94 04/05/2022 0905   CALCIUM 9.6 04/05/2022 0905    Lipid Panel     Component Value Date/Time   CHOL 264 (H) 04/05/2022 0905   TRIG 172 (H) 04/05/2022 0905   HDL 44 04/05/2022 0905   CHOLHDL 6.0 (H) 04/05/2022 0905   VLDL 24.8 05/05/2019 0953   LDLCALC 186 (H) 04/05/2022 0905    CBC    Component Value Date/Time   WBC 5.7 04/05/2022 0905   RBC 5.34 04/05/2022 0905   HGB 16.7 04/05/2022 0905   HCT 48.9 04/05/2022 0905   PLT 263 04/05/2022 0905   MCV 91.6 04/05/2022 0905   MCH 31.3 04/05/2022 0905   MCHC 34.2 04/05/2022 0905   RDW 12.6 04/05/2022 0905    Hgb A1C Lab Results  Component Value Date   HGBA1C 6.0 (H) 04/05/2022            Assessment & Plan:   Acute Headache, Sore Throat, Fever and Body Aches:  Rapid flu negative Rapid covid negative Rapid flu negative 80 mg Depo medrol IM No indication for abx at this time Encouraged rest and fluids Can take Ibuprofen OTC as needed for headaches and sore throat  RTC in 6 months, follow-up chronic conditions Webb Silversmith, NP

## 2022-11-10 ENCOUNTER — Telehealth: Payer: 59 | Admitting: Physician Assistant

## 2022-11-10 DIAGNOSIS — U071 COVID-19: Secondary | ICD-10-CM

## 2022-11-10 MED ORDER — NIRMATRELVIR/RITONAVIR (PAXLOVID)TABLET: 3.0000 | ORAL_TABLET | Freq: Two times a day (BID) | ORAL | 0 refills | Status: AC

## 2022-11-10 NOTE — Patient Instructions (Signed)
Douglas Avila, thank you for joining [name], PA-C for today's virtual visit.  While this provider is not your primary care provider (PCP), if your PCP is located in our provider database this encounter information will be shared with them immediately following your visit.   A [place] MyChart account gives you access to today's visit and all your visits, tests, and labs performed at [facility] " click here if you don't have a [facility] MyChart account or go to [email or web address]  Consent: (Patient) Douglas Avila provided verbal consent for this virtual visit at the beginning of the encounter.  Current Medications:  Current Outpatient Medications:    acyclovir (ZOVIRAX) 800 MG tablet, Take 1 tablet (800 mg total) by mouth 2 (two) times daily., Disp: 30 tablet, Rfl: 3   clobetasol cream (TEMOVATE) 0.05 %, Apply 1 application topically 2 (two) times daily., Disp: 30 g, Rfl: 5   lovastatin (MEVACOR) 10 MG tablet, Take 1 tablet (10 mg total) by mouth at bedtime., Disp: 90 tablet, Rfl: 1   vitamin C (ASCORBIC ACID) 500 MG tablet, Take 500 mg by mouth daily., Disp: , Rfl:    Zinc Sulfate (ZINC-220 PO), Take by mouth., Disp: , Rfl:    Medications ordered in this encounter:  No orders of the defined types were placed in this encounter.    *If you need refills on other medications prior to your next appointment, please contact your pharmacy*  Follow-Up: Call back or seek an in-person evaluation if the symptoms worsen or if the condition fails to improve as anticipated.  [facility] [phone]  Care Instructions: Please keep well-hydrated and get plenty of rest. Start a saline nasal rinse to flush out your nasal passages. You can use plain Mucinex to help thin congestion. If you have a humidifier, running in the bedroom at night. I want you to start OTC vitamin D3 1000 units daily, vitamin C 1000 mg daily, and a zinc  supplement. Please take prescribed medications as directed. Hold off on taking the Lovastatin while on antiviral medication. You can restart 5 days after completion of the Paxlovid.      Isolation Instructions: You are to isolate at home until you have been fever free for at least 24 hours without a fever-reducing medication, and symptoms have been steadily improving for 24 hours. At that time,  you can end isolation but need to mask for an additional 5 days.   If you must be around other household members who do not have symptoms, you need to make sure that both you and the family members are masking consistently with a high-quality mask.  If you note any worsening of symptoms despite treatment, please seek an in-person evaluation ASAP. If you note any significant shortness of breath or any chest pain, please seek ER evaluation. Please do not delay care!   COVID-19: What to Do if You Are Sick If you test positive and are an older adult or someone who is at high risk of getting very sick from COVID-19, treatment may be available. Contact a healthcare provider right away after a positive test to determine if you are eligible, even if your symptoms are mild right now. You can also visit a Test to Treat location and, if eligible, receive a prescription from a provider. Don't delay: Treatment must be started within the first few days to be effective. If you have a fever, cough, or other symptoms, you might have COVID-19. Most people  have mild illness and are able to recover at home. If you are sick: Keep track of your symptoms. If you have an emergency warning sign (including trouble breathing), call 911. Steps to help prevent the spread of COVID-19 if you are sick If you are sick with COVID-19 or think you might have COVID-19, follow the steps below to care for yourself and to help protect other people in your home and community. Stay home except to get medical care Stay home. Most people with  COVID-19 have mild illness and can recover at home without medical care. Do not leave your home, except to get medical care. Do not visit public areas and do not go to places where you are unable to wear a mask. Take care of yourself. Get rest and stay hydrated. Take over-the-counter medicines, such as acetaminophen, to help you feel better. Stay in touch with your doctor. Call before you get medical care. Be sure to get care if you have trouble breathing, or have any other emergency warning signs, or if you think it is an emergency. Avoid public transportation, ride-sharing, or taxis if possible. Get tested If you have symptoms of COVID-19, get tested. While waiting for test results, stay away from others, including staying apart from those living in your household. Get tested as soon as possible after your symptoms start. Treatments may be available for people with COVID-19 who are at risk for becoming very sick. Don't delay: Treatment must be started early to be effective--some treatments must begin within 5 days of your first symptoms. Contact your healthcare provider right away if your test result is positive to determine if you are eligible. Self-tests are one of several options for testing for the virus that causes COVID-19 and may be more convenient than laboratory-based tests and point-of-care tests. Ask your healthcare provider or your local health department if you need help interpreting your test results. You can visit your state, tribal, local, and territorial health department's website to look for the latest local information on testing sites. Separate yourself from other people As much as possible, stay in a specific room and away from other people and pets in your home. If possible, you should use a separate bathroom. If you need to be around other people or animals in or outside of the home, wear a well-fitting mask. Tell your close contacts that they may have been exposed to COVID-19.  An infected person can spread COVID-19 starting 48 hours (or 2 days) before the person has any symptoms or tests positive. By letting your close contacts know they may have been exposed to COVID-19, you are helping to protect everyone. See COVID-19 and Animals if you have questions about pets. If you are diagnosed with COVID-19, someone from the health department may call you. Answer the call to slow the spread. Monitor your symptoms Symptoms of COVID-19 include fever, cough, or other symptoms. Follow care instructions from your healthcare provider and local health department. Your local health authorities may give instructions on checking your symptoms and reporting information. When to seek emergency medical attention Look for emergency warning signs* for COVID-19. If someone is showing any of these signs, seek emergency medical care immediately: Trouble breathing Persistent pain or pressure in the chest New confusion Inability to wake or stay awake Pale, gray, or blue-colored skin, lips, or nail beds, depending on skin tone *This list is not all possible symptoms. Please call your medical provider for any other symptoms that are severe or concerning to  you. Call 911 or call ahead to your local emergency facility: Notify the operator that you are seeking care for someone who has or may have COVID-19. Call ahead before visiting your doctor Call ahead. Many medical visits for routine care are being postponed or done by phone or telemedicine. If you have a medical appointment that cannot be postponed, call your doctor's office, and tell them you have or may have COVID-19. This will help the office protect themselves and other patients. If you are sick, wear a well-fitting mask You should wear a mask if you must be around other people or animals, including pets (even at home). Wear a mask with the best fit, protection, and comfort for you. You don't need to wear the mask if you are alone. If you  can't put on a mask (because of trouble breathing, for example), cover your coughs and sneezes in some other way. Try to stay at least 6 feet away from other people. This will help protect the people around you. Masks should not be placed on young children under age 38 years, anyone who has trouble breathing, or anyone who is not able to remove the mask without help. Cover your coughs and sneezes Cover your mouth and nose with a tissue when you cough or sneeze. Throw away used tissues in a lined trash can. Immediately wash your hands with soap and water for at least 20 seconds. If soap and water are not available, clean your hands with an alcohol-based hand sanitizer that contains at least 60% alcohol. Clean your hands often Wash your hands often with soap and water for at least 20 seconds. This is especially important after blowing your nose, coughing, or sneezing; going to the bathroom; and before eating or preparing food. Use hand sanitizer if soap and water are not available. Use an alcohol-based hand sanitizer with at least 60% alcohol, covering all surfaces of your hands and rubbing them together until they feel dry. Soap and water are the best option, especially if hands are visibly dirty. Avoid touching your eyes, nose, and mouth with unwashed hands. Handwashing Tips Avoid sharing personal household items Do not share dishes, drinking glasses, cups, eating utensils, towels, or bedding with other people in your home. Wash these items thoroughly after using them with soap and water or put in the dishwasher. Clean surfaces in your home regularly Clean and disinfect high-touch surfaces (for example, doorknobs, tables, handles, light switches, and countertops) in your "sick room" and bathroom. In shared spaces, you should clean and disinfect surfaces and items after each use by the person who is ill. If you are sick and cannot clean, a caregiver or other person should only clean and disinfect the  area around you (such as your bedroom and bathroom) on an as needed basis. Your caregiver/other person should wait as long as possible (at least several hours) and wear a mask before entering, cleaning, and disinfecting shared spaces that you use. Clean and disinfect areas that may have blood, stool, or body fluids on them. Use household cleaners and disinfectants. Clean visible dirty surfaces with household cleaners containing soap or detergent. Then, use a household disinfectant. Use a product from [employer] List N: Disinfectants for Coronavirus (COVID-19). Be sure to follow the instructions on the label to ensure safe and effective use of the product. Many products recommend keeping the surface wet with a disinfectant for a certain period of time (look at "contact time" on the product label). You may also need to  wear personal protective equipment, such as gloves, depending on the directions on the product label. Immediately after disinfecting, wash your hands with soap and water for 20 seconds. For completed guidance on cleaning and disinfecting your home, visit Complete Disinfection Guidance. Take steps to improve ventilation at home Improve ventilation (air flow) at home to help prevent from spreading COVID-19 to other people in your household. Clear out COVID-19 virus particles in the air by opening windows, using air filters, and turning on fans in your home. Use this interactive tool to learn how to improve air flow in your home. When you can be around others after being sick with COVID-19 Deciding when you can be around others is different for different situations. Find out when you can safely end home isolation. For any additional questions about your care, contact your healthcare provider or state or local health department. 05/17/2020 Content source: [facility] for Immunization and Respiratory Diseases ([facility]), Division of Viral Diseases This information is not intended to replace  advice given to you by your health care provider. Make sure you discuss any questions you have with your health care provider. Document Revised: 06/30/2020 Document Reviewed: 06/30/2020 Elsevier Patient Education  2022 [employer].     If you have been instructed to have an in-person evaluation today at a local Urgent Care facility, please use the link below. It will take you to a list of all of our available [facility], including address, phone number and hours of operation. Please do not delay care.  [facility]  If you or a family member do not have a primary care provider, use the link below to schedule a visit and establish care. When you choose a [facility] primary care physician or advanced practice provider, you gain a long-term partner in health. Find a Primary Care Provider  Learn more about [facility]'s in-office and virtual care options: [facility] - Get Care Now

## 2022-11-10 NOTE — Progress Notes (Signed)
Virtual Visit Consent   [name], you are scheduled for a virtual visit with a [facility] provider today. Just as with appointments in the office, your consent must be obtained to participate. Your consent will be active for this visit and any virtual visit you may have with one of our providers in the next 365 days. If you have a MyChart account, a copy of this consent can be sent to you electronically.  As this is a virtual visit, video technology does not allow for your provider to perform a traditional examination. This may limit your provider's ability to fully assess your condition. If your provider identifies any concerns that need to be evaluated in person or the need to arrange testing (such as labs, EKG, etc.), we will make arrangements to do so. Although advances in technology are sophisticated, we cannot ensure that it will always work on either your end or our end. If the connection with a video visit is poor, the visit may have to be switched to a telephone visit. With either a video or telephone visit, we are not always able to ensure that we have a secure connection.  By engaging in this virtual visit, you consent to the provision of healthcare and authorize for your insurance to be billed (if applicable) for the services provided during this visit. Depending on your insurance coverage, you may receive a charge related to this service.  I need to obtain your verbal consent now. Are you willing to proceed with your visit today? LAROME Avila has provided verbal consent on 11/10/2022 for a virtual visit (video or telephone). Douglas Avila, [place]  Date: 11/10/2022 12:12 PM  Virtual Visit via Video Note   I, Douglas Avila, connected with  [name]  ([id number], 17-Oct-1970) on 11/10/22 at 12:15 PM EDT by a video-enabled telemedicine application and verified that I am speaking with the correct person using two identifiers.  Location: Patient: Virtual Visit  Location Patient: Home Provider: Virtual Visit Location Provider: Home Office   I discussed the limitations of evaluation and management by telemedicine and the availability of in person appointments. The patient expressed understanding and agreed to proceed.    History of Present Illness: Douglas Avila is a 52 y.o. who identifies as a male who was assigned male at birth, and is being seen today for COVID-19. Endorses symptom onset last night with fatigue and feeling off, ear pressure. This morning with fever, chills, sore throat. As such took a home COVID test that was positive.  Denies chest pain or SOB, GI symptoms. Denies recent travel or sick contact.  OTC -- Ibuprofen  HPI: HPI  Problems:  Patient Active Problem List   Diagnosis Date Noted   Overweight with body mass index (BMI) of 25 to 25.9 in adult 07/12/2021   HLD (hyperlipidemia) 05/05/2019   Erectile dysfunction 01/31/2018   Psoriasis 01/31/2018   Genital herpes 05/26/2013    Allergies: No Known Allergies Medications:  Current Outpatient Medications:    acyclovir (ZOVIRAX) 800 MG tablet, Take 1 tablet (800 mg total) by mouth 2 (two) times daily., Disp: 30 tablet, Rfl: 3   clobetasol cream (TEMOVATE) 0.05 %, Apply 1 application topically 2 (two) times daily., Disp: 30 g, Rfl: 5   lovastatin (MEVACOR) 10 MG tablet, Take 1 tablet (10 mg total) by mouth at bedtime., Disp: 90 tablet, Rfl: 1   vitamin C (ASCORBIC ACID) 500 MG tablet, Take 500 mg by mouth daily., Disp: ,  Rfl:    Zinc Sulfate (ZINC-220 PO), Take by mouth., Disp: , Rfl:   Observations/Objective: Patient is well-developed, well-nourished in no acute distress.  Resting comfortably  at home.  Head is normocephalic, atraumatic.  No labored breathing.  Speech is clear and coherent with logical content.  Patient is alert and oriented at baseline.   Assessment and Plan: 1. COVID-19 Discussed risks/benefits of antiviral medications including most common  potential ADRs. Patient voiced understanding and would like to proceed with antiviral medication. They are candidate for Paxlovid. Rx sent to pharmacy. Supportive measures, OTC medications and vitamin regimen reviewed. Quarantine reviewed in detail. Strict ER precautions discussed with patient.    Follow Up Instructions: I discussed the assessment and treatment plan with the patient. The patient was provided an opportunity to ask questions and all were answered. The patient agreed with the plan and demonstrated an understanding of the instructions.  A copy of instructions were sent to the patient via MyChart unless otherwise noted below.   The patient was advised to call back or seek an in-person evaluation if the symptoms worsen or if the condition fails to improve as anticipated.  Time:  I spent 10 minutes with the patient via telehealth technology discussing the above problems/concerns.    Douglas Climes, PA-C

## 2023-04-09 ENCOUNTER — Other Ambulatory Visit: Payer: Self-pay | Admitting: Internal Medicine

## 2023-04-10 NOTE — Telephone Encounter (Signed)
Requested Prescriptions  Pending Prescriptions Disp Refills   acyclovir (ZOVIRAX) 800 MG tablet [Pharmacy Med Name: Acyclovir 800 MG Oral Tablet] 180 tablet 0    Sig: Take 1 tablet by mouth twice daily     Antimicrobials:  Antiviral Agents - Anti-Herpetic Passed - 04/10/2023  9:02 AM      Passed - Valid encounter within last 12 months    Recent Outpatient Visits           11 months ago Fever, unspecified fever cause   [place] [facility] [place], Salvadore Oxford, NP   1 year ago Encounter for general adult medical examination with abnormal findings   [place] [facility] [place], Salvadore Oxford, NP   1 year ago Viral URI with cough   [place] [facility] [place], Salvadore Oxford, NP   1 year ago Mixed hyperlipidemia   [place] [facility], Salvadore Oxford, NP   2 years ago Heart palpitations   [place] [facility] [place], Salvadore Oxford, [place]

## 2023-05-09 ENCOUNTER — Other Ambulatory Visit: Payer: Self-pay | Admitting: Internal Medicine

## 2023-05-09 NOTE — Telephone Encounter (Signed)
 Unable to refill per protocol, appointment needed.   Requested Prescriptions  Pending Prescriptions Disp Refills   lovastatin (MEVACOR) 10 MG tablet [Pharmacy Med Name: Lovastatin 10 MG Oral Tablet] 30 tablet 0    Sig: TAKE 1 TABLET BY MOUTH AT BEDTIME     Cardiovascular:  Antilipid - Statins 2 Failed - 05/09/2023  1:37 PM      Failed - Cr in normal range and within 360 days    Creat  Date Value Ref Range Status  04/05/2022 0.94 0.70 - 1.30 mg/dL Final         Failed - Valid encounter within last 12 months    Recent Outpatient Visits           1 year ago Fever, unspecified fever cause   [facility] [place], Salvadore Oxford, NP   1 year ago Encounter for general adult medical examination with abnormal findings   [place] [facility] [place], Salvadore Oxford, NP   1 year ago Viral URI with cough   [place] [facility] [place], Salvadore Oxford, NP   1 year ago Mixed hyperlipidemia   [place] [facility] [place], Salvadore Oxford, NP   2 years ago Heart palpitations   [place] [facility] [employer], [place], NP              Failed - Lipid Panel in normal range within the last 12 months    Cholesterol  Date Value Ref Range Status  04/05/2022 264 (H) <200 mg/dL Final   LDL Cholesterol (Calc)  Date Value Ref Range Status  04/05/2022 186 (H) mg/dL (calc) Final    Comment:    Reference range: <100 . Desirable range <100 mg/dL for primary prevention;   <70 mg/dL for patients with CHD or diabetic patients  with > or = 2 CHD risk factors. Marland Kitchen LDL-C is now calculated using the Martin-Hopkins  calculation, which is a validated novel method providing  better accuracy than the Friedewald equation in the  estimation of LDL-C.  Horald Pollen et al. Lenox Ahr. 1610;960(45): 2061-2068  (http://education.[employer].com/faq/FAQ164)    HDL  Date Value Ref Range Status  04/05/2022 44 > OR = 40 mg/dL Final   Triglycerides   Date Value Ref Range Status  04/05/2022 172 (H) <150 mg/dL Final         Passed - Patient is not pregnant

## 2023-05-20 ENCOUNTER — Other Ambulatory Visit: Payer: Self-pay

## 2023-08-02 ENCOUNTER — Ambulatory Visit (INDEPENDENT_AMBULATORY_CARE_PROVIDER_SITE_OTHER): Admitting: Internal Medicine

## 2023-08-02 ENCOUNTER — Encounter: Payer: Self-pay | Admitting: Internal Medicine

## 2023-08-02 VITALS — BP 132/84 | Ht 72.0 in | Wt 192.4 lb

## 2023-08-02 DIAGNOSIS — M79642 Pain in left hand: Secondary | ICD-10-CM

## 2023-08-02 DIAGNOSIS — Z23 Encounter for immunization: Secondary | ICD-10-CM | POA: Diagnosis not present

## 2023-08-02 DIAGNOSIS — M79641 Pain in right hand: Secondary | ICD-10-CM

## 2023-08-02 DIAGNOSIS — Z0001 Encounter for general adult medical examination with abnormal findings: Secondary | ICD-10-CM | POA: Diagnosis not present

## 2023-08-02 DIAGNOSIS — E663 Overweight: Secondary | ICD-10-CM

## 2023-08-02 DIAGNOSIS — E782 Mixed hyperlipidemia: Secondary | ICD-10-CM

## 2023-08-02 DIAGNOSIS — Z6826 Body mass index (BMI) 26.0-26.9, adult: Secondary | ICD-10-CM

## 2023-08-02 DIAGNOSIS — R7303 Prediabetes: Secondary | ICD-10-CM

## 2023-08-02 DIAGNOSIS — Z114 Encounter for screening for human immunodeficiency virus [HIV]: Secondary | ICD-10-CM

## 2023-08-02 MED ORDER — LOVASTATIN 10 MG PO TABS: 10.0000 mg | ORAL_TABLET | Freq: Every day | ORAL | 1 refills | Status: DC

## 2023-08-02 NOTE — Assessment & Plan Note (Signed)
 Encourage diet and exercise for weight loss

## 2023-08-02 NOTE — Patient Instructions (Signed)
 Health Maintenance, Male  Adopting a healthy lifestyle and getting preventive care are important in promoting health and wellness. Ask your health care provider about:  The right schedule for you to have regular tests and exams.  Things you can do on your own to prevent diseases and keep yourself healthy.  What should I know about diet, weight, and exercise?  Eat a healthy diet    Eat a diet that includes plenty of vegetables, fruits, low-fat dairy products, and lean protein.  Do not eat a lot of foods that are high in solid fats, added sugars, or sodium.  Maintain a healthy weight  Body mass index (BMI) is a measurement that can be used to identify possible weight problems. It estimates body fat based on height and weight. Your health care provider can help determine your BMI and help you achieve or maintain a healthy weight.  Get regular exercise  Get regular exercise. This is one of the most important things you can do for your health. Most adults should:  Exercise for at least 150 minutes each week. The exercise should increase your heart rate and make you sweat (moderate-intensity exercise).  Do strengthening exercises at least twice a week. This is in addition to the moderate-intensity exercise.  Spend less time sitting. Even light physical activity can be beneficial.  Watch cholesterol and blood lipids  Have your blood tested for lipids and cholesterol at 52 years of age, then have this test every 5 years.  You may need to have your cholesterol levels checked more often if:  Your lipid or cholesterol levels are high.  You are older than 52 years of age.  You are at high risk for heart disease.  What should I know about cancer screening?  Many types of cancers can be detected early and may often be prevented. Depending on your health history and family history, you may need to have cancer screening at various ages. This may include screening for:  Colorectal cancer.  Prostate cancer.  Skin cancer.  Lung  cancer.  What should I know about heart disease, diabetes, and high blood pressure?  Blood pressure and heart disease  High blood pressure causes heart disease and increases the risk of stroke. This is more likely to develop in people who have high blood pressure readings or are overweight.  Talk with your health care provider about your target blood pressure readings.  Have your blood pressure checked:  Every 3-5 years if you are 9-95 years of age.  Every year if you are 85 years old or older.  If you are between the ages of 29 and 29 and are a current or former smoker, ask your health care provider if you should have a one-time screening for abdominal aortic aneurysm (AAA).  Diabetes  Have regular diabetes screenings. This checks your fasting blood sugar level. Have the screening done:  Once every three years after age 23 if you are at a normal weight and have a low risk for diabetes.  More often and at a younger age if you are overweight or have a high risk for diabetes.  What should I know about preventing infection?  Hepatitis B  If you have a higher risk for hepatitis B, you should be screened for this virus. Talk with your health care provider to find out if you are at risk for hepatitis B infection.  Hepatitis C  Blood testing is recommended for:  Everyone born from [birth year] through 1964.  Anyone  with known risk factors for hepatitis C.  Sexually transmitted infections (STIs)  You should be screened each year for STIs, including gonorrhea and chlamydia, if:  You are sexually active and are younger than 52 years of age.  You are older than 52 years of age and your health care provider tells you that you are at risk for this type of infection.  Your sexual activity has changed since you were last screened, and you are at increased risk for chlamydia or gonorrhea. Ask your health care provider if you are at risk.  Ask your health care provider about whether you are at high risk for HIV. Your health care provider  may recommend a prescription medicine to help prevent HIV infection. If you choose to take medicine to prevent HIV, you should first get tested for HIV. You should then be tested every 3 months for as long as you are taking the medicine.  Follow these instructions at home:  Alcohol use  Do not drink alcohol if your health care provider tells you not to drink.  If you drink alcohol:  Limit how much you have to 0-2 drinks a day.  Know how much alcohol is in your drink. In the [place] one drink equals one 12 oz bottle of beer (355 mL), one 5 oz glass of wine (148 mL), or one 1 oz glass of hard liquor (44 mL).  Lifestyle  Do not use any products that contain nicotine or tobacco. These products include cigarettes, chewing tobacco, and vaping devices, such as e-cigarettes. If you need help quitting, ask your health care provider.  Do not use street drugs.  Do not share needles.  Ask your health care provider for help if you need support or information about quitting drugs.  General instructions  Schedule regular health, dental, and eye exams.  Stay current with your vaccines.  Tell your health care provider if:  You often feel depressed.  You have ever been abused or do not feel safe at home.  Summary  Adopting a healthy lifestyle and getting preventive care are important in promoting health and wellness.  Follow your health care provider's instructions about healthy diet, exercising, and getting tested or screened for diseases.  Follow your health care provider's instructions on monitoring your cholesterol and blood pressure.  This information is not intended to replace advice given to you by your health care provider. Make sure you discuss any questions you have with your health care provider.  Document Revised: 07/04/2020 Document Reviewed: 07/04/2020  Elsevier Patient Education  2024 [employer].

## 2023-08-02 NOTE — Progress Notes (Signed)
 Subjective:    Patient ID: Douglas Avila, male    DOB: 1970/06/14, 53 y.o.   MRN: [id number]  HPI  Patient presents to clinic today for his annual exam.    Flu: 12/2022 Tetanus: 04/2013 COVID: Moderna x 2 Shingrix: 12/2021 PSA screening: 06/2021 Colon screening: 10/2021 Vision screening: as needed Dentist: annually  Diet: He does eat meat. He consumes fruits and veggies. He does eat some fried foods. He drinks mostly water, coffee. Exercise: body weight exercises.  Review of Systems     Past Medical History:  Diagnosis Date   Chicken pox    Depression    Hyperlipidemia     Current Outpatient Medications  Medication Sig Dispense Refill   acyclovir  (ZOVIRAX ) 800 MG tablet Take 1 tablet by mouth twice daily 180 tablet 0   clobetasol  cream (TEMOVATE ) 0.05 % Apply 1 application topically 2 (two) times daily. 30 g 5   lovastatin  (MEVACOR ) 10 MG tablet Take 1 tablet (10 mg total) by mouth at bedtime. 90 tablet 1   vitamin C (ASCORBIC ACID) 500 MG tablet Take 500 mg by mouth daily.     Zinc Sulfate (ZINC-220 PO) Take by mouth.     No current facility-administered medications for this visit.    No Known Allergies  Family History  Problem Relation Age of Onset   CVA Mother    Diabetes Father    Healthy Brother    Colon cancer Neg Hx    Hearing loss Neg Hx    Prostate cancer Neg Hx     Social History   Socioeconomic History   Marital status: Single    Spouse name: Not on file   Number of children: Not on file   Years of education: Not on file   Highest education level: Not on file  Occupational History   Not on file  Tobacco Use   Smoking status: Never   Smokeless tobacco: Never  Vaping Use   Vaping status: Never Used  Substance and Sexual Activity   Alcohol use: Yes    Alcohol/week: 3.0 standard drinks of alcohol    Types: 3 Cans of beer per week    Comment: occasional   Drug use: No   Sexual activity: Yes  Other Topics Concern   Not on file   Social History Narrative   ** Merged History Encounter **       Social Drivers of Corporate investment banker Strain: Not on file  Food Insecurity: Not on file  Transportation Needs: Not on file  Physical Activity: Not on file  Stress: Not on file  Social Connections: Not on file  Intimate Partner Violence: Not on file     Constitutional: Denies fever, malaise, fatigue, headache or abrupt weight changes.  HEENT: Denies eye pain, eye redness, ear pain, ringing in the ears, wax buildup, runny nose, nasal congestion, bloody nose, or sore throat. Respiratory: Denies difficulty breathing, shortness of breath, cough or sputum production.   Cardiovascular: Denies chest pain, chest tightness, palpitations or swelling in the hands or feet.  Gastrointestinal: Denies abdominal pain, bloating, constipation, diarrhea or blood in the stool.  GU: Patient reports erectile dysfunction.  Denies urgency, frequency, pain with urination, burning sensation, blood in urine, odor or discharge. Musculoskeletal: Patient reports joint pain and swelling in hands.  Denies decrease in range of motion, difficulty with gait, muscle pain.  Skin: Denies redness, rashes, lesions or ulcercations.  Neurological: Denies dizziness, difficulty with memory, difficulty with speech or  problems with balance and coordination.  Psych: Denies anxiety, depression, SI/HI.  No other specific complaints in a complete review of systems (except as listed in HPI above).  Objective:   Physical Exam   BP 132/84 (BP Location: Left Arm, Patient Position: Sitting, Cuff Size: Normal)   Ht 6' (1.829 m)   Wt 192 lb 6.4 oz (87.3 kg)   BMI 26.09 kg/m    Wt Readings from Last 3 Encounters:  04/23/22 194 lb (88 kg)  04/05/22 191 lb (86.6 kg)  02/20/22 187 lb (84.8 kg)    General: Appears his stated age, overweight, in NAD. Skin: Warm, dry and intact. No rashes, lesions or ulcerations noted. HEENT: Head: normal shape and size; Eyes:  sclera white, no icterus, conjunctiva pink, PERRLA and EOMs intact;  Neck:  Neck supple, trachea midline. No masses, lumps or thyromegaly present.  Cardiovascular: Normal rate and rhythm. S1,S2 noted.  No murmur, rubs or gallops noted. No JVD or BLE edema.  Pulmonary/Chest: Normal effort and positive vesicular breath sounds. No respiratory distress. No wheezes, rales or ronchi noted.  Abdomen: Normal bowel sounds. Musculoskeletal: Bouchard's nodes noted of bilateral thumbs.  Strength 5/5 BUE/BLE.  No difficulty with gait.  Neurological: Alert and oriented. Cranial nerves II-XII grossly intact. Coordination normal.  Psychiatric: Mood and affect normal. Behavior is normal. Judgment and thought content normal.     BMET    Component Value Date/Time   NA 140 04/05/2022 0905   K 4.2 04/05/2022 0905   CL 102 04/05/2022 0905   CO2 27 04/05/2022 0905   GLUCOSE 104 (H) 04/05/2022 0905   BUN 14 04/05/2022 0905   CREATININE 0.94 04/05/2022 0905   CALCIUM  9.6 04/05/2022 0905    Lipid Panel     Component Value Date/Time   CHOL 264 (H) 04/05/2022 0905   TRIG 172 (H) 04/05/2022 0905   HDL 44 04/05/2022 0905   CHOLHDL 6.0 (H) 04/05/2022 0905   VLDL 24.8 05/05/2019 0953   LDLCALC 186 (H) 04/05/2022 0905    CBC    Component Value Date/Time   WBC 5.7 04/05/2022 0905   RBC 5.34 04/05/2022 0905   HGB 16.7 04/05/2022 0905   HCT 48.9 04/05/2022 0905   PLT 263 04/05/2022 0905   MCV 91.6 04/05/2022 0905   MCH 31.3 04/05/2022 0905   MCHC 34.2 04/05/2022 0905   RDW 12.6 04/05/2022 0905    Hgb A1C Lab Results  Component Value Date   HGBA1C 6.0 (H) 04/05/2022          Assessment & Plan:   Preventative Health Maintenance:  Flu shot UTD Tetanus today Encouraged him to get his COVID booster Shingrix today Colon screening UTD Encouraged him to consume a balanced diet and exercise regimen Advised him to see an eye doctor and dentist annually We will check CBC, c-Met, lipid, A1c and  PSA today  Screen for STD:  Will check HIV, RPR, hep C, urine gonorrhea, chlamydia and trichomonas   RTC in 6 months, follow-up chronic conditions Helayne Lo, NP

## 2023-08-06 ENCOUNTER — Other Ambulatory Visit

## 2023-08-06 DIAGNOSIS — Z114 Encounter for screening for human immunodeficiency virus [HIV]: Secondary | ICD-10-CM

## 2023-08-06 DIAGNOSIS — E782 Mixed hyperlipidemia: Secondary | ICD-10-CM

## 2023-08-06 DIAGNOSIS — R7303 Prediabetes: Secondary | ICD-10-CM

## 2023-08-06 DIAGNOSIS — Z0001 Encounter for general adult medical examination with abnormal findings: Secondary | ICD-10-CM

## 2023-08-07 ENCOUNTER — Ambulatory Visit: Payer: Self-pay | Admitting: Internal Medicine

## 2023-08-07 DIAGNOSIS — R748 Abnormal levels of other serum enzymes: Secondary | ICD-10-CM

## 2023-08-07 LAB — COMPREHENSIVE METABOLIC PANEL WITH GFR
AG Ratio: 2 (calc) (ref 1.0–2.5)
ALT: 51 U/L — ABNORMAL HIGH (ref 9–46)
AST: 36 U/L — ABNORMAL HIGH (ref 10–35)
Albumin: 4.5 g/dL (ref 3.6–5.1)
Alkaline phosphatase (APISO): 56 U/L (ref 35–144)
BUN: 12 mg/dL (ref 7–25)
CO2: 28 mmol/L (ref 20–32)
Calcium: 9.2 mg/dL (ref 8.6–10.3)
Chloride: 102 mmol/L (ref 98–110)
Creat: 0.94 mg/dL (ref 0.70–1.30)
Globulin: 2.3 g/dL (ref 1.9–3.7)
Glucose, Bld: 133 mg/dL — ABNORMAL HIGH (ref 65–99)
Potassium: 4 mmol/L (ref 3.5–5.3)
Sodium: 137 mmol/L (ref 135–146)
Total Bilirubin: 0.6 mg/dL (ref 0.2–1.2)
Total Protein: 6.8 g/dL (ref 6.1–8.1)
eGFR: 98 mL/min/{1.73_m2} (ref >=60)

## 2023-08-07 LAB — CBC
HCT: 48.7 % (ref 38.5–50.0)
Hemoglobin: 16.5 g/dL (ref 13.2–17.1)
MCH: 30.6 pg (ref 27.0–33.0)
MCHC: 33.9 g/dL (ref 32.0–36.0)
MCV: 90.4 fL (ref 80.0–100.0)
MPV: 10.2 fL (ref 7.5–12.5)
Platelets: 243 10*3/uL (ref 140–400)
RBC: 5.39 10*6/uL (ref 4.20–5.80)
RDW: 13.1 % (ref 11.0–15.0)
WBC: 7.6 10*3/uL (ref 3.8–10.8)

## 2023-08-07 LAB — LIPID PANEL
Cholesterol: 200 mg/dL — ABNORMAL HIGH (ref <200)
HDL: 39 mg/dL — ABNORMAL LOW (ref >=40)
LDL Cholesterol (Calc): 136 mg/dL — ABNORMAL HIGH
Non-HDL Cholesterol (Calc): 161 mg/dL — ABNORMAL HIGH (ref <130)
Total CHOL/HDL Ratio: 5.1 (calc) — ABNORMAL HIGH (ref <5.0)
Triglycerides: 130 mg/dL (ref <150)

## 2023-08-07 LAB — HIV ANTIBODY (ROUTINE TESTING W REFLEX): HIV 1&2 Ab, 4th Generation: NONREACTIVE

## 2023-08-07 LAB — HEMOGLOBIN A1C
Hgb A1c MFr Bld: 6.2 % — ABNORMAL HIGH (ref <5.7)
Mean Plasma Glucose: 131 mg/dL
eAG (mmol/L): 7.3 mmol/L

## 2023-08-07 LAB — HEPATITIS C ANTIBODY: Hepatitis C Ab: NONREACTIVE

## 2023-08-07 LAB — RPR: RPR Ser Ql: NONREACTIVE

## 2023-08-07 MED ORDER — LOVASTATIN 20 MG PO TABS: 20.0000 mg | ORAL_TABLET | Freq: Every day | ORAL | 1 refills | Status: AC

## 2023-08-14 ENCOUNTER — Ambulatory Visit

## 2023-12-04 ENCOUNTER — Encounter: Payer: Self-pay | Admitting: Internal Medicine

## 2023-12-04 ENCOUNTER — Ambulatory Visit (INDEPENDENT_AMBULATORY_CARE_PROVIDER_SITE_OTHER): Admitting: Internal Medicine

## 2023-12-04 VITALS — BP 138/88 | Ht 72.0 in | Wt 194.0 lb

## 2023-12-04 DIAGNOSIS — L989 Disorder of the skin and subcutaneous tissue, unspecified: Secondary | ICD-10-CM

## 2023-12-04 DIAGNOSIS — Z23 Encounter for immunization: Secondary | ICD-10-CM

## 2023-12-04 NOTE — Patient Instructions (Signed)
 Basal Cell Carcinoma Basal cell carcinoma is the most common form of skin cancer. It begins in the basal cells, which are at the bottom of the outer skin layer (epidermis). Basal cell carcinoma can often be cured. It rarely spreads to other areas of the body (metastasizes). It may come back at the same location (recur), but it can be treated again if this happens. Basal cell carcinoma occurs most often on parts of the body that are frequently exposed to the sun, such as: Parts of the head, including the scalp or face. Ears. Neck. Arms or legs. Backs of the hands. What are the causes? This condition is usually caused by exposure to ultraviolet (UV) light. UV light may come from the sun or from tanning beds. Other causes include: Exposure to arsenic, a highly poisonous metal. Exposure to high-energy X-rays (radiation). Exposure to toxic tars and oils. Certain genetic conditions, such as a condition that makes a person sensitive to sunlight. What increases the risk? You are more likely to develop this condition if: You are older than 52 years of age. You have: Fair skin (light complexion), blond or red hair, or blue, green, or gray eye color. Childhood freckling. Had repeated sunburns or sun exposure over long periods of time, especially during childhood. A weakened body defense system (immune system). Been exposed to certain chemicals, such as tar, soot, and arsenic. Chronic inflammatory conditions or infections. A family or personal history of basal cell carcinoma. You use tanning beds. What are the signs or symptoms? The main symptom of this condition is a growth or lesion on the skin. The shape and color of the growth or lesion may vary. The main types include: An open sore that may remain open for three weeks or longer. The sore may bleed or crust. This type of lesion can be an early sign of basal cell carcinoma. Basal cell carcinoma often shows up as a sore that does not heal. A  reddish area that may crust, itch, or cause discomfort. This may occur on areas that are exposed to the sun. These patches might be easier to feel than to see. A shiny or clear bump that is red, white, or pink. In people who have dark hair, the bump is often tan, black, or brown. These bumps can look like moles. A pink growth with a raised border. The growth will have a crusted and indented area in the center. Small blood vessels may appear on the surface of the growth as it gets bigger. A scar-like area that looks like shiny, stretched skin. The area may be white, yellow, or waxy. It often has irregular borders. This may be a sign of more aggressive basal cell carcinoma. How is this diagnosed? This condition may be diagnosed with: A physical exam. Removal of a tissue sample to be examined under a microscope (biopsy). How is this treated? Treatment for this condition involves removing the cancerous tissue. The method that is used for this depends on the type, size, location, and number of tumors. Possible treatments include: Surgery, such as: Mohs surgery. In this procedure, the cancerous skin cells are removed layer by layer until all of the tumor has been removed. Surgical removal (excision) of the tumor. This involves removing the entire tumor and a small amount of normal skin that surrounds it. Cryosurgery. This involves freezing the tumor with liquid nitrogen. Plastic surgery. The tumor is removed, and healthy skin from another part of the body is used to cover the wound. This  may be done for large tumors that are in areas where it is not possible to stretch the nearby skin to sew the edges of the wound together. Therapies or treatments, such as: Radiation. This may be used for tumors on the face. Photodynamic therapy. A chemical cream is applied to the skin, and light exposure is used to activate the chemical. Electrodesiccation and curettage. This involves alternately scraping and burning  the tumor while using an electric current to control bleeding. Chemical treatments, such as imiquimod cream and interferon injections. These may be used to remove superficial tumors with minimal scarring. Follow these instructions at home: Avoid direct exposure to the sun. Do self-exams as told by your health care provider. Look for new spots or changes in your skin. Keep all follow-up visits. This is important. How is this prevented?  Avoid the sun when it is the strongest. This is usually between 10 a.m. and 4 p.m. When you are out in the sun, use a sunscreen that has a sun protection factor (SPF) of at least 30. Apply sunscreen at least 30 minutes before exposure to the sun. Reapply sunscreen every 2-4 hours while you are outside. Also reapply it after swimming and after excessive sweating. Always wear hats, protective clothing, and UV-blocking sunglasses when you are outdoors. Do not use tanning beds. Contact a health care provider if: You notice any new spots or any changes in your skin. You have had a basal cell carcinoma tumor removed, and you notice a new growth in the same location. Get help right away if: You have a spot that is sore and does not heal. You have a spot that bleeds easily. Summary Basal cell carcinoma is the most common form of skin cancer. It begins in the bottom of the outer skin layer (epidermis). Basal cell carcinoma can almost always be cured. This condition is usually caused by exposure to ultraviolet (UV) light. It mostly affects the face, scalp, neck, ears, arms, legs, or backs of the hands. The main symptom of this condition is a growth or lesion on the skin that can vary in shape and color. You can prevent this cancer by avoiding direct exposure to the sun, applying sunscreen of at least 30 SPF, and wearing protective clothing. Apply sunscreen 30 minutes before you go out into the sun, and reapply every 2-4 hours while you are outside. This information is  not intended to replace advice given to you by your health care provider. Make sure you discuss any questions you have with your health care provider. Document Revised: 06/16/2020 Document Reviewed: 06/16/2020 Elsevier Patient Education  2024 [employer].

## 2023-12-04 NOTE — Progress Notes (Signed)
 Subjective:    Patient ID: Douglas Avila, male    DOB: [birth year], 53 y.o.   MRN: [id number]  HPI  Discussed the use of AI scribe software for clinical note transcription with the patient, who gave verbal consent to proceed.  Douglas Avila is a 53 year old male who presents with a concerning spot on his nose.  He has noticed a spot on his nose for about a month. The spot does not itch, burn, or drain significantly, although there may be slight drainage after a shower. He is concerned about the spot on his nose and sought evaluation because it appeared quickly and he was worried about waiting too long to be seen. No history of picking at the spot.  He has an appointment scheduled with [facility] in late November.       Review of Systems     Past Medical History:  Diagnosis Date   Chicken pox    Depression    Hyperlipidemia     Current Outpatient Medications  Medication Sig Dispense Refill   acyclovir  (ZOVIRAX ) 800 MG tablet Take 1 tablet by mouth twice daily 180 tablet 0   clobetasol  cream (TEMOVATE ) 0.05 % Apply 1 application topically 2 (two) times daily. 30 g 5   lovastatin  (MEVACOR ) 20 MG tablet Take 1 tablet (20 mg total) by mouth at bedtime. 90 tablet 1   vitamin C (ASCORBIC ACID) 500 MG tablet Take 500 mg by mouth daily.     Zinc Sulfate (ZINC-220 PO) Take by mouth.     No current facility-administered medications for this visit.    No Known Allergies  Family History  Problem Relation Age of Onset   CVA Mother    Diabetes Father    Healthy Brother    Colon cancer Neg Hx    Hearing loss Neg Hx    Prostate cancer Neg Hx     Social History   Socioeconomic History   Marital status: Single    Spouse name: Not on file   Number of children: Not on file   Years of education: Not on file   Highest education level: 12th grade  Occupational History   Not on file  Tobacco Use   Smoking status: Never   Smokeless tobacco: Never  Vaping  Use   Vaping status: Never Used  Substance and Sexual Activity   Alcohol use: Yes    Alcohol/week: 3.0 standard drinks of alcohol    Types: 3 Cans of beer per week    Comment: occasional   Drug use: No   Sexual activity: Yes  Other Topics Concern   Not on file  Social History Narrative   ** Merged History Encounter **       Social Drivers of Health   Financial Resource Strain: Low Risk  (12/03/2023)   Overall Financial Resource Strain (CARDIA)    Difficulty of Paying Living Expenses: Not hard at all  Food Insecurity: No Food Insecurity (12/03/2023)   Hunger Vital Sign    Worried About Running Out of Food in the Last Year: Never true    Ran Out of Food in the Last Year: Never true  Transportation Needs: No Transportation Needs (12/03/2023)   PRAPARE - Administrator, Civil Service (Medical): No    Lack of Transportation (Non-Medical): No  Physical Activity: Sufficiently Active (12/03/2023)   Exercise Vital Sign    Days of Exercise per Week: 5 days    Minutes of  Exercise per Session: 30 min  Stress: Stress Concern Present (12/03/2023)   [employer] of Occupational Health - Occupational Stress Questionnaire    Feeling of Stress: To some extent  Social Connections: Socially Isolated (12/03/2023)   Social Connection and Isolation Panel    Frequency of Communication with Friends and Family: Three times a week    Frequency of Social Gatherings with Friends and Family: Three times a week    Attends Religious Services: Never    Active Member of Clubs or Organizations: No    Attends Engineer, structural: Not on file    Marital Status: Divorced  Intimate Partner Violence: Not on file     Constitutional: Denies fever, malaise, fatigue, headache or abrupt weight changes.  Respiratory: Denies difficulty breathing, shortness of breath, cough or sputum production.   Cardiovascular: Denies chest pain, chest tightness, palpitations or swelling in the hands or feet.   Skin: Patient reports skin lesion of nose.  Denies redness, rashes, or ulcercations.  Neurological: Denies dizziness, difficulty with memory, difficulty with speech or problems with balance and coordination.   No other specific complaints in a complete review of systems (except as listed in HPI above).  Objective:   Physical Exam   BP 138/88 (BP Location: Left Arm, Patient Position: Sitting, Cuff Size: Normal)   Ht 6' (1.829 m)   Wt 194 lb (88 kg)   BMI 26.31 kg/m     Wt Readings from Last 3 Encounters:  08/02/23 192 lb 6.4 oz (87.3 kg)  04/23/22 194 lb (88 kg)  04/05/22 191 lb (86.6 kg)    General: Appears his stated age, overweight, in NAD. Skin: 1 cm raised, ulcerated lesion to the right side nasal bridge.  HEENT: Head: normal shape and size; Eyes: sclera white, no icterus, conjunctiva pink, PERRLA and EOMs intact;  Cardiovascular: Normal rate and rhythm.  Pulmonary/Chest: Normal effort and positive vesicular breath sounds. No respiratory distress. No wheezes, rales or ronchi noted.  Neurological: Alert and oriented.    BMET    Component Value Date/Time   NA 137 08/06/2023 0821   K 4.0 08/06/2023 0821   CL 102 08/06/2023 0821   CO2 28 08/06/2023 0821   GLUCOSE 133 (H) 08/06/2023 0821   BUN 12 08/06/2023 0821   CREATININE 0.94 08/06/2023 0821   CALCIUM  9.2 08/06/2023 0821    Lipid Panel     Component Value Date/Time   CHOL 200 (H) 08/06/2023 0821   TRIG 130 08/06/2023 0821   HDL 39 (L) 08/06/2023 0821   CHOLHDL 5.1 (H) 08/06/2023 0821   VLDL 24.8 05/05/2019 0953   LDLCALC 136 (H) 08/06/2023 0821    CBC    Component Value Date/Time   WBC 7.6 08/06/2023 0821   RBC 5.39 08/06/2023 0821   HGB 16.5 08/06/2023 0821   HCT 48.7 08/06/2023 0821   PLT 243 08/06/2023 0821   MCV 90.4 08/06/2023 0821   MCH 30.6 08/06/2023 0821   MCHC 33.9 08/06/2023 0821   RDW 13.1 08/06/2023 0821    Hgb A1C Lab Results  Component Value Date   HGBA1C 6.2 (H) 08/06/2023           Assessment & Plan:    Assessment and Plan    Skin lesion of nose, suspected basal cell carcinoma of the nose Lesion on the nose with rapid growth in sun-exposed area. - Keep dermatology appointment in late November. -It is unlikely that even with a referral, I can have you seen sooner than  that - Advise sunscreen application on affected area outdoors. - Instruct to avoid touching or picking at lesion.   RTC in 2 months, follow-up chronic conditions Angeline Laura, NP

## 2024-01-22 NOTE — Progress Notes (Deleted)
 Office Visit Note  Patient: Douglas Avila             Date of Birth: 10/07/1970           MRN: [id number]             PCP: Antonette Angeline ORN, NP Referring: Antonette Angeline ORN, NP Visit Date: 02/05/2024 Occupation: Data Unavailable  Subjective:  No chief complaint on file.   History of Present Illness: Douglas Avila is a 53 y.o. male ***     Activities of Daily Living:  Patient reports morning stiffness for *** {minute/hour:19697}.   Patient {ACTIONS;DENIES/REPORTS:21021675::Denies} nocturnal pain.  Difficulty dressing/grooming: {ACTIONS;DENIES/REPORTS:21021675::Denies} Difficulty climbing stairs: {ACTIONS;DENIES/REPORTS:21021675::Denies} Difficulty getting out of chair: {ACTIONS;DENIES/REPORTS:21021675::Denies} Difficulty using hands for taps, buttons, cutlery, and/or writing: {ACTIONS;DENIES/REPORTS:21021675::Denies}  No Rheumatology ROS completed.   PMFS History:  Patient Active Problem List   Diagnosis Date Noted   Overweight with body mass index (BMI) of 26 to 26.9 in adult 07/12/2021   HLD (hyperlipidemia) 05/05/2019   Erectile dysfunction 01/31/2018   Psoriasis 01/31/2018   Genital herpes 05/26/2013    Past Medical History:  Diagnosis Date   Chicken pox    Depression    Hyperlipidemia     Family History  Problem Relation Age of Onset   CVA Mother    Diabetes Father    Healthy Brother    Colon cancer Neg Hx    Hearing loss Neg Hx    Prostate cancer Neg Hx    Past Surgical History:  Procedure Laterality Date   COLONOSCOPY WITH PROPOFOL  N/A 11/13/2021   Procedure: COLONOSCOPY WITH PROPOFOL ;  Surgeon: Unk Corinn Skiff, MD;  Location: [facility];  Service: Gastroenterology;  Laterality: N/A;   LUMBAR DISC SURGERY  2016   Social History   Tobacco Use   Smoking status: Never   Smokeless tobacco: Never  Vaping Use   Vaping status: Never Used  Substance Use Topics   Alcohol use: Yes    Alcohol/week: 3.0 standard drinks of alcohol     Types: 3 Cans of beer per week    Comment: occasional   Drug use: No   Social History   Social History Narrative   ** Merged History Encounter **         Immunization History  Administered Date(s) Administered   Influenza, Seasonal, Injecte, Preservative Fre 12/04/2023   Influenza,inj,Quad PF,6+ Mos 01/28/2017, 12/11/2017, 12/01/2018, 12/02/2019   Influenza-Unspecified 12/31/2021   Moderna Sars-Covid-2 Vaccination 04/24/2019, 05/19/2019   Pfizer(Comirnaty)Fall Seasonal Vaccine 12 years and older 12/31/2021   Tdap 05/26/2013, 08/02/2023   Zoster Recombinant(Shingrix ) 12/31/2021, 08/02/2023     Objective: Vital Signs: There were no vitals taken for this visit.   Physical Exam   Musculoskeletal Exam: ***  CDAI Exam: CDAI Score: -- Patient Global: --; Provider Global: -- Swollen: --; Tender: -- Joint Exam 02/05/2024   No joint exam has been documented for this visit   There is currently no information documented on the homunculus. Go to the Rheumatology activity and complete the homunculus joint exam.  Investigation: No additional findings.  Imaging: No results found.  Recent Labs: Lab Results  Component Value Date   WBC 7.6 08/06/2023   HGB 16.5 08/06/2023   PLT 243 08/06/2023   NA 137 08/06/2023   K 4.0 08/06/2023   CL 102 08/06/2023   CO2 28 08/06/2023   GLUCOSE 133 (H) 08/06/2023   BUN 12 08/06/2023   CREATININE 0.94 08/06/2023   BILITOT 0.6 08/06/2023   ALKPHOS  70 05/05/2019   AST 36 (H) 08/06/2023   ALT 51 (H) 08/06/2023   PROT 6.8 08/06/2023   ALBUMIN 4.6 05/05/2019   CALCIUM  9.2 08/06/2023   February 2024 ANA negative, sed rate 6, CRP 0.8, RF<14  August 06, 2023 LDL 136, hemoglobin A1c 6.2, hepatitis C nonreactive, HIV nonreactive, RPR non-reactive  Speciality Comments: No specialty comments available.  Procedures:  No procedures performed Allergies: Patient has no known allergies.   Assessment / Plan:     Visit Diagnoses: No diagnosis  found.  Orders: No orders of the defined types were placed in this encounter.  No orders of the defined types were placed in this encounter.   Face-to-face time spent with patient was *** minutes. Greater than 50% of time was spent in counseling and coordination of care.  Follow-Up Instructions: No follow-ups on file.   Maya Nash, MD  Note - This record has been created using Animal nutritionist.  Chart creation errors have been sought, but may not always  have been located. Such creation errors do not reflect on  the standard of medical care.

## 2024-02-04 ENCOUNTER — Ambulatory Visit: Admitting: Internal Medicine

## 2024-02-05 ENCOUNTER — Encounter: Admitting: Rheumatology

## 2024-02-05 DIAGNOSIS — L409 Psoriasis, unspecified: Secondary | ICD-10-CM

## 2024-02-05 DIAGNOSIS — E663 Overweight: Secondary | ICD-10-CM

## 2024-02-05 DIAGNOSIS — M79642 Pain in left hand: Secondary | ICD-10-CM

## 2024-02-05 DIAGNOSIS — A6001 Herpesviral infection of penis: Secondary | ICD-10-CM

## 2024-02-05 DIAGNOSIS — E782 Mixed hyperlipidemia: Secondary | ICD-10-CM

## 2024-02-26 NOTE — Progress Notes (Unsigned)
 "  Office Visit Note  Patient: Douglas Avila             Date of Birth: [birth year]           MRN: [id number]             PCP: Antonette Angeline ORN, NP Referring: Antonette Angeline ORN, NP Visit Date: 03/11/2024 Occupation: Data Unavailable  Subjective:  No chief complaint on file.   History of Present Illness: Douglas Avila is a 53 y.o. male ***     Activities of Daily Living:  Patient reports morning stiffness for *** {minute/hour:19697}.   Patient {ACTIONS;DENIES/REPORTS:21021675::Denies} nocturnal pain.  Difficulty dressing/grooming: {ACTIONS;DENIES/REPORTS:21021675::Denies} Difficulty climbing stairs: {ACTIONS;DENIES/REPORTS:21021675::Denies} Difficulty getting out of chair: {ACTIONS;DENIES/REPORTS:21021675::Denies} Difficulty using hands for taps, buttons, cutlery, and/or writing: {ACTIONS;DENIES/REPORTS:21021675::Denies}  No Rheumatology ROS completed.   PMFS History:  Patient Active Problem List   Diagnosis Date Noted   Overweight with body mass index (BMI) of 26 to 26.9 in adult 07/12/2021   HLD (hyperlipidemia) 05/05/2019   Erectile dysfunction 01/31/2018   Psoriasis 01/31/2018   Genital herpes 05/26/2013    Past Medical History:  Diagnosis Date   Chicken pox    Depression    Hyperlipidemia     Family History  Problem Relation Age of Onset   CVA Mother    Diabetes Father    Healthy Brother    Colon cancer Neg Hx    Hearing loss Neg Hx    Prostate cancer Neg Hx    Past Surgical History:  Procedure Laterality Date   COLONOSCOPY WITH PROPOFOL  N/A 11/13/2021   Procedure: COLONOSCOPY WITH PROPOFOL ;  Surgeon: Unk Corinn Skiff, MD;  Location: [facility];  Service: Gastroenterology;  Laterality: N/A;   LUMBAR DISC SURGERY  2016   Social History[1] Social History   Social History Narrative   ** Merged History Encounter **         Immunization History  Administered Date(s) Administered   Influenza, Seasonal, Injecte, Preservative Fre  12/04/2023   Influenza,inj,Quad PF,6+ Mos 01/28/2017, 12/11/2017, 12/01/2018, 12/02/2019   Influenza-Unspecified 12/31/2021   Moderna Sars-Covid-2 Vaccination 04/24/2019, 05/19/2019   Pfizer(Comirnaty)Fall Seasonal Vaccine 12 years and older 12/31/2021   Tdap 05/26/2013, 08/02/2023   Zoster Recombinant(Shingrix ) 12/31/2021, 08/02/2023     Objective: Vital Signs: There were no vitals taken for this visit.   Physical Exam   Musculoskeletal Exam: ***  CDAI Exam: CDAI Score: -- Patient Global: --; Provider Global: -- Swollen: --; Tender: -- Joint Exam 03/11/2024   No joint exam has been documented for this visit   There is currently no information documented on the homunculus. Go to the Rheumatology activity and complete the homunculus joint exam.  Investigation: No additional findings.  Imaging: No results found.  Recent Labs: Lab Results  Component Value Date   WBC 7.6 08/06/2023   HGB 16.5 08/06/2023   PLT 243 08/06/2023   NA 137 08/06/2023   K 4.0 08/06/2023   CL 102 08/06/2023   CO2 28 08/06/2023   GLUCOSE 133 (H) 08/06/2023   BUN 12 08/06/2023   CREATININE 0.94 08/06/2023   BILITOT 0.6 08/06/2023   ALKPHOS 70 05/05/2019   AST 36 (H) 08/06/2023   ALT 51 (H) 08/06/2023   PROT 6.8 08/06/2023   ALBUMIN 4.6 05/05/2019   CALCIUM  9.2 08/06/2023   April 05, 2022 LDL 186, hemoglobin A1c 6.0, ANA negative, sed rate 6, CRP 0.8, rheumatoid factor negative August 06, 2023 LDL 161, hepatitis C nonreactive, RPR nonreactive, HIV nonreactive  Speciality Comments: No specialty comments available.  Procedures:  No procedures performed Allergies: Patient has no known allergies.   Assessment / Plan:     Visit Diagnoses: No diagnosis found.  Orders: No orders of the defined types were placed in this encounter.  No orders of the defined types were placed in this encounter.   Face-to-face time spent with patient was *** minutes. Greater than 50% of time was spent in  counseling and coordination of care.  Follow-Up Instructions: No follow-ups on file.   Maya Nash, MD  Note - This record has been created using Animal nutritionist.  Chart creation errors have been sought, but may not always  have been located. Such creation errors do not reflect on  the standard of medical care.    [1]  Social History Tobacco Use   Smoking status: Never   Smokeless tobacco: Never  Vaping Use   Vaping status: Never Used  Substance Use Topics   Alcohol use: Yes    Alcohol/week: 3.0 standard drinks of alcohol    Types: 3 Cans of beer per week    Comment: occasional   Drug use: No   "

## 2024-03-11 ENCOUNTER — Ambulatory Visit: Admitting: Rheumatology
